# Patient Record
Sex: Male | Born: 1935 | Race: White | Hispanic: No | Marital: Married | Smoking: Former smoker
Health system: Southern US, Community
[De-identification: ages and names within clinical notes are randomized; demographics above are authoritative.]

## PROBLEM LIST (undated history)

## (undated) DIAGNOSIS — R7303 Prediabetes: Secondary | ICD-10-CM

## (undated) DIAGNOSIS — E039 Hypothyroidism, unspecified: Secondary | ICD-10-CM

## (undated) DIAGNOSIS — F32A Depression, unspecified: Secondary | ICD-10-CM

## (undated) DIAGNOSIS — K219 Gastro-esophageal reflux disease without esophagitis: Secondary | ICD-10-CM

## (undated) DIAGNOSIS — I10 Essential (primary) hypertension: Secondary | ICD-10-CM

## (undated) DIAGNOSIS — F419 Anxiety disorder, unspecified: Secondary | ICD-10-CM

## (undated) HISTORY — DX: Gastro-esophageal reflux disease without esophagitis: K21.9

## (undated) HISTORY — DX: Anxiety disorder, unspecified: F41.9

## (undated) HISTORY — PX: COLONOSCOPY: SHX174

---

## 2008-05-05 ENCOUNTER — Emergency Department (HOSPITAL_COMMUNITY): Admission: EM | Admit: 2008-05-05 | Discharge: 2008-05-05 | Payer: Self-pay | Admitting: Emergency Medicine

## 2008-11-10 ENCOUNTER — Encounter: Payer: Self-pay | Admitting: Gastroenterology

## 2008-11-23 ENCOUNTER — Encounter: Payer: Self-pay | Admitting: Gastroenterology

## 2008-11-23 ENCOUNTER — Ambulatory Visit: Payer: Self-pay | Admitting: Gastroenterology

## 2008-11-23 ENCOUNTER — Ambulatory Visit (HOSPITAL_COMMUNITY): Admission: RE | Admit: 2008-11-23 | Discharge: 2008-11-23 | Payer: Self-pay | Admitting: Gastroenterology

## 2008-11-25 ENCOUNTER — Encounter: Payer: Self-pay | Admitting: Gastroenterology

## 2010-12-13 NOTE — Op Note (Signed)
NAME:  Corey Frederick, Corey Frederick             ACCOUNT NO.:  [id number]   MEDICAL RECORD NO.:  [id number]          PATIENT TYPE:  AMB   LOCATION:  DAY                           FACILITY:  [facility]   PHYSICIAN:  Kassie Mends, M.D.      DATE OF BIRTH:  [birth year]   DATE OF PROCEDURE:  11/23/2008  DATE OF DISCHARGE:                               OPERATIVE REPORT   REFERRING PHYSICIAN:  Edward L. Corey Frederick, M.D.   PROCEDURE:  Colonoscopy with snare cautery polypectomy.   INDICATION FOR EXAM:  Mr. Corey Frederick is a 76 year old male who  presents for colon cancer screening.   FINDINGS:  1. A 6-mm sessile hepatic flexure polyp removed via snare cautery.  2. Frequent sigmoid colon diverticula.  3. Small internal hemorrhoids.  Otherwise no masses, inflammatory      changes, or AVM seen.   DIAGNOSES:  1. Diverticulosis.  2. Hemorrhoids.  3. Polyp.   RECOMMENDATIONS:  1. Screening colonoscopy in 10 years if he has a simple adenoma.  2. Will call Mr. Corey Frederick with the results of his biopsies.  3. No aspirin, NSAIDs, or anticoagulation for 7 days.   MEDICATIONS:  1. Demerol 100 mg IV.  2. Versed 4 mg IV.   DESCRIPTION OF PROCEDURE:  Physical exam was performed.  Informed  consent was obtained from the patient explaining benefits, risks and  alternatives to the procedure.  The patient was connected to monitor and  placed in left lateral position.  Continuous oxygen was provided by  nasal cannula.  IV medicine administered through an indwelling cannula.  After administration of sedation and rectal exam, the patient's rectum  was intubated and the scope was advanced under direct visualization to  the cecum.  The scope was removed slowly by carefully examining  the color, texture, anatomy, and integrity of the mucosa on the way out.  The patient was recovered in Endoscopy and discharged home in  satisfactory condition.   PATH:  Simple adenoma      Kassie Mends, M.D.  Electronically  Signed     SM/MEDQ  D:  11/23/2008  T:  11/24/2008  Job:  [id number]   cc:   Corey Frederick L. Corey Frederick, M.D.  Fax: [phone]

## 2011-08-03 ENCOUNTER — Encounter (INDEPENDENT_AMBULATORY_CARE_PROVIDER_SITE_OTHER): Payer: Self-pay | Admitting: *Deleted

## 2011-08-21 ENCOUNTER — Ambulatory Visit (INDEPENDENT_AMBULATORY_CARE_PROVIDER_SITE_OTHER): Payer: Self-pay | Admitting: Internal Medicine

## 2014-11-17 ENCOUNTER — Ambulatory Visit (HOSPITAL_COMMUNITY)
Admission: RE | Admit: 2014-11-17 | Discharge: 2014-11-17 | Disposition: A | Discharge status: Home / Self Care | Payer: Medicare Other | Source: Ambulatory Visit | Attending: Pulmonary Disease | Admitting: Pulmonary Disease

## 2014-11-17 ENCOUNTER — Other Ambulatory Visit (HOSPITAL_COMMUNITY): Payer: Self-pay | Admitting: Pulmonary Disease

## 2014-11-17 DIAGNOSIS — R2231 Localized swelling, mass and lump, right upper limb: Secondary | ICD-10-CM | POA: Diagnosis not present

## 2015-06-14 ENCOUNTER — Encounter (INDEPENDENT_AMBULATORY_CARE_PROVIDER_SITE_OTHER): Payer: Self-pay | Admitting: *Deleted

## 2015-06-14 ENCOUNTER — Encounter (INDEPENDENT_AMBULATORY_CARE_PROVIDER_SITE_OTHER): Payer: Self-pay | Admitting: Internal Medicine

## 2015-06-14 ENCOUNTER — Other Ambulatory Visit (INDEPENDENT_AMBULATORY_CARE_PROVIDER_SITE_OTHER): Payer: Self-pay | Admitting: Internal Medicine

## 2015-06-14 ENCOUNTER — Ambulatory Visit (INDEPENDENT_AMBULATORY_CARE_PROVIDER_SITE_OTHER): Payer: Medicare Other | Admitting: Internal Medicine

## 2015-06-14 VITALS — BP 108/78 | HR 66 | Temp 97.7°F | Resp 18 | Ht 70.0 in | Wt 158.5 lb

## 2015-06-14 DIAGNOSIS — N400 Enlarged prostate without lower urinary tract symptoms: Secondary | ICD-10-CM | POA: Insufficient documentation

## 2015-06-14 DIAGNOSIS — R1010 Upper abdominal pain, unspecified: Secondary | ICD-10-CM | POA: Diagnosis not present

## 2015-06-14 DIAGNOSIS — K219 Gastro-esophageal reflux disease without esophagitis: Secondary | ICD-10-CM | POA: Insufficient documentation

## 2015-06-14 DIAGNOSIS — R140 Abdominal distension (gaseous): Secondary | ICD-10-CM | POA: Diagnosis not present

## 2015-06-14 NOTE — Progress Notes (Signed)
Presenting complaint;  Bloating. Recent episode of epigastric pain with nausea and vomiting.  History of present illness:  Patient is 80 year old Caucasian male who is referred to courtesy of Dr. Juanetta Gosling for GI evaluation. He states she's had problems with bloating for the last few years. It generally is worse at night. He has smoked bloating now than he used to. On the evening of 05/22/2015 after eating at a [employer] he developed intractable bloating epigastric pain and nausea and self-induced vomiting in order to get relief. In addition to 3 episodes of self induce vomiting he had 3 spontaneous episodes. He noted scant amount of blood towards the end. He was evaluated in emergency room at no one tells in [facility]. He had extensive evaluation. Troponin level and EKG were normal. LFTs and serum lipase were normal. Patient also underwent abdominopelvic CT results of which are reviewed under lab data. He was given IV pantoprazole. He felt better and was discharged and advised to follow-up with PCP. He states he has no bloating when he wakes up in the morning. Bloating starts after lunch and gets worse after evening meal and most pronounced when he goes to bed. Bloating is never associated with nausea vomiting or pain. Only time he had pain across upper abdomen was 3 weeks ago when he got sick. He also complains of postprandial diarrhea. He denies melena or rectal bleeding. He gives history of heartburn. He states he was on pantoprazole possibly for 5 years. It was stopped earlier this year because of diarrhea. He was advised to go back on pantoprazole following his ER visit 3 weeks ago. He has no difficulty swallowing solids but at times he has difficulty with water. He does not feel that he swallows too much air. He tells me both his father and brother had surgery for gallbladder disease. He does not take OTC NSAIDs. He is not sure why he is taking ramipril. He states he's  never been diagnosed with hypertension.   Current Medications: Outpatient Encounter Prescriptions as of 06/14/2015  Medication Sig  . citalopram (CELEXA) 20 MG tablet Take 20 mg by mouth daily.  . finasteride (PROSCAR) 5 MG tablet Take 5 mg by mouth daily.  Marland Kitchen FLUZONE HIGH-DOSE 0.5 ML SUSY ADM 0.5ML IM UTD  . LORazepam (ATIVAN) 1 MG tablet Take 1 mg by mouth at bedtime.  . pantoprazole (PROTONIX) 40 MG tablet Take 40 mg by mouth daily.  . ramipril (ALTACE) 2.5 MG capsule Take 2.5 mg by mouth daily.  . tamsulosin (FLOMAX) 0.4 MG CAPS capsule Take 0.4 mg by mouth daily.  . Vitamin D, Cholecalciferol, 1000 UNITS CAPS Take 1,000 Units by mouth daily.   No facility-administered encounter medications on file as of 06/14/2015.   Past medical history: GERD. Colonoscopy in April 2010 with removal of small adenoma(Dr. Darrick Penna). Stress disorder. BPH. ? Mild hypertension(he states he has never been told he has hypertension but he is on low-dose ramipril).  Allergies: NKA  Family history: father died of coronary artery disease at age 46 . Mother lived to be 100. He has one brother age 31 with diabetes. Sister died of emphysema at age 51.   Social history: He is married but does not have any children. 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 He goes to fitness center 3 times a week. He has never smoked cigarettes and drinks alcohol occasionally.   Physical examination: :Blood pressure 108/78, pulse 66, temperature 97.7 F (36.5 C), temperature  source Oral, resp. rate 18, height 5\' 10"  (1.778 m), weight 158 lb 8 oz (71.895 kg). Patient is alert and in no acute distress. Conjunctiva is pink. Sclera is nonicteric Oropharyngeal mucosa is normal. No neck masses or thyromegaly noted. Cardiac exam with regular rhythm normal S1 and S2. No murmur or gallop noted. Lungs are clear to auscultation. Abdomen is symmetrical. Bowel sounds are normal. On palpation abdomen  is soft and nontender without organomegaly or masses.  Rectal examination deferred.  No LE edema or clubbing noted.  Labs/studies Results:  lab data from 05/23/2015  WBC 9.2, H&H 15.7 and 44.6. Platelet count 209K.  Serum sodium 139, potassium 3.6, chloride 92, CO2 26, BUN 20, creatinine 1.40 Glucose 249. Serum calcium 10.7. Bilirubin 1.10, AP 50, AST 32, ALT 31, total protein 7.4 and albumin 4.3.  Abdominopelvic CT with contrast  Moderate gastric distention, mild wall thickening to distal esophagus, small hypodense focus in liver and hypodense foci in both kidneys. These most likely represent cysts. small left renal stone, scattered colonic diverticula, mild prostate enlargement and granulomas spleen.    Assessment:  #1.Acute symptoms that patient suffered three weeks ago would appear to be secondary to gastroenteritis. #2. Chronic postprandial bloating. Need to rule out biliary tract disease, peptic ulcer disease or gastroparesis.   Plan:  Hemoccult 1. Upper abdominal ultrasound. Diagnostic esophagogastroduodenoscopy. Patient can try Phazyme or Gas-X after lunch and evening meal daily.

## 2015-06-14 NOTE — Patient Instructions (Signed)
Esophagogastroduodenoscopy to be scheduled. Physician will call with results of ultrasound when completed

## 2015-06-16 ENCOUNTER — Ambulatory Visit (HOSPITAL_COMMUNITY)
Admission: RE | Admit: 2015-06-16 | Discharge: 2015-06-16 | Disposition: A | Discharge status: Home / Self Care | Payer: Medicare Other | Source: Ambulatory Visit | Attending: Internal Medicine | Admitting: Internal Medicine

## 2015-06-16 DIAGNOSIS — R1010 Upper abdominal pain, unspecified: Secondary | ICD-10-CM

## 2015-06-16 DIAGNOSIS — R1013 Epigastric pain: Secondary | ICD-10-CM | POA: Diagnosis not present

## 2015-06-16 DIAGNOSIS — R932 Abnormal findings on diagnostic imaging of liver and biliary tract: Secondary | ICD-10-CM | POA: Diagnosis not present

## 2015-06-16 DIAGNOSIS — R140 Abdominal distension (gaseous): Secondary | ICD-10-CM | POA: Diagnosis not present

## 2015-06-17 ENCOUNTER — Encounter (INDEPENDENT_AMBULATORY_CARE_PROVIDER_SITE_OTHER): Payer: Self-pay | Admitting: *Deleted

## 2015-06-18 ENCOUNTER — Telehealth (INDEPENDENT_AMBULATORY_CARE_PROVIDER_SITE_OTHER): Payer: Self-pay | Admitting: *Deleted

## 2015-06-18 ENCOUNTER — Encounter (HOSPITAL_COMMUNITY)
Admission: RE | Disposition: A | Discharge status: Home Health | Payer: Self-pay | Source: Ambulatory Visit | Attending: Internal Medicine

## 2015-06-18 ENCOUNTER — Encounter (HOSPITAL_COMMUNITY): Payer: Self-pay | Admitting: *Deleted

## 2015-06-18 ENCOUNTER — Ambulatory Visit (HOSPITAL_COMMUNITY)
Admission: RE | Admit: 2015-06-18 | Discharge: 2015-06-18 | Disposition: A | Discharge status: Home Health | Payer: Medicare Other | Source: Ambulatory Visit | Attending: Internal Medicine | Admitting: Internal Medicine

## 2015-06-18 DIAGNOSIS — R197 Diarrhea, unspecified: Secondary | ICD-10-CM | POA: Insufficient documentation

## 2015-06-18 DIAGNOSIS — K228 Other specified diseases of esophagus: Secondary | ICD-10-CM | POA: Insufficient documentation

## 2015-06-18 DIAGNOSIS — K449 Diaphragmatic hernia without obstruction or gangrene: Secondary | ICD-10-CM | POA: Insufficient documentation

## 2015-06-18 DIAGNOSIS — R112 Nausea with vomiting, unspecified: Secondary | ICD-10-CM | POA: Insufficient documentation

## 2015-06-18 DIAGNOSIS — K219 Gastro-esophageal reflux disease without esophagitis: Secondary | ICD-10-CM

## 2015-06-18 DIAGNOSIS — F419 Anxiety disorder, unspecified: Secondary | ICD-10-CM | POA: Insufficient documentation

## 2015-06-18 DIAGNOSIS — R1013 Epigastric pain: Secondary | ICD-10-CM | POA: Diagnosis not present

## 2015-06-18 DIAGNOSIS — Z79899 Other long term (current) drug therapy: Secondary | ICD-10-CM | POA: Insufficient documentation

## 2015-06-18 DIAGNOSIS — Z87891 Personal history of nicotine dependence: Secondary | ICD-10-CM | POA: Diagnosis not present

## 2015-06-18 DIAGNOSIS — R140 Abdominal distension (gaseous): Secondary | ICD-10-CM | POA: Insufficient documentation

## 2015-06-18 HISTORY — PX: ESOPHAGOGASTRODUODENOSCOPY: SHX5428

## 2015-06-18 SURGERY — EGD (ESOPHAGOGASTRODUODENOSCOPY)
Anesthesia: Moderate Sedation

## 2015-06-18 MED ORDER — MIDAZOLAM HCL 5 MG/5ML IJ SOLN
INTRAMUSCULAR | Status: DC | PRN
  Administered 2015-06-18: 1 mg via INTRAVENOUS
  Administered 2015-06-18 (×2): 2 mg via INTRAVENOUS
  Administered 2015-06-18 (×2): 1 mg via INTRAVENOUS

## 2015-06-18 MED ORDER — SODIUM CHLORIDE 0.9 % IV SOLN
INTRAVENOUS | Status: DC
  Administered 2015-06-18: 1000 mL via INTRAVENOUS

## 2015-06-18 MED ORDER — BUTAMBEN-TETRACAINE-BENZOCAINE 2-2-14 % EX AERO
INHALATION_SPRAY | CUTANEOUS | Status: DC | PRN
  Administered 2015-06-18: 2 via TOPICAL

## 2015-06-18 MED ORDER — MEPERIDINE HCL 50 MG/ML IJ SOLN
INTRAMUSCULAR | Status: DC | PRN
  Administered 2015-06-18 (×2): 25 mg via INTRAVENOUS

## 2015-06-18 MED ORDER — MIDAZOLAM HCL 5 MG/5ML IJ SOLN
INTRAMUSCULAR | Status: AC
  Filled 2015-06-18: qty 10

## 2015-06-18 MED ORDER — ALIGN 4 MG PO CAPS: 1.0000 | ORAL_CAPSULE | Freq: Every day | ORAL | Status: DC

## 2015-06-18 MED ORDER — STERILE WATER FOR IRRIGATION IR SOLN
Status: DC | PRN
  Administered 2015-06-18: 2.5 mL

## 2015-06-18 MED ORDER — MEPERIDINE HCL 50 MG/ML IJ SOLN
INTRAMUSCULAR | Status: AC
  Filled 2015-06-18: qty 1

## 2015-06-18 NOTE — Op Note (Signed)
EGD PROCEDURE REPORT  PATIENT:  Corey Frederick  MR#:  [id number] Birthdate:  07/15/1935, 80 y.o., male Endoscopist:  Dr. Malissa HippoNajeeb U. Rehman, MD Referred By:  Dr. Fredirick MaudlinEdward L Hawkins, MD  Procedure Date: 06/18/2015  Procedure:   EGD  Indications: Patient is 80 year old Caucasian male who presents with the history of bloating which he has experienced over the last 3 years but has gotten worse particularly at night. He recently experienced epigastric pain with nausea and vomiting and was seen in emergency room. Ultrasound was negative for cholelithiasis.             Informed Consent:  The risks, benefits, alternatives & imponderables which include, but are not limited to, bleeding, infection, perforation, drug reaction and potential missed lesion have been reviewed.  The potential for biopsy, lesion removal, esophageal dilation, etc. have also been discussed.  Questions have been answered.  All parties agreeable.  Please see history & physical in medical record for more information.  Medications:  Demerol 50 mg IV Versed 7 mg IV Cetacaine spray topically for oropharyngeal anesthesia  Description of procedure:  The endoscope was introduced through the mouth and advanced to the second portion of the duodenum without difficulty or limitations. The mucosal surfaces were surveyed very carefully during advancement of the scope and upon withdrawal. Initially I was not able to advanced to every scope across the hypopharynx into esophagus. Esophagus was then intubated with ultra Slim scope and then with Q scope.  Findings:  Esophagus:  Somewhat dilated body of the esophagus but mucosa was normal. GEJ:  39 cm Hiatus:  42 cm Stomach:  Very large stomach. There was scant amount of food debris. Mucosa at gastric body and antrum was normal. Small semi-lunar-shaped pylorus. Angularis fundus and cardia were unremarkable. Scope could not be freely passed into second part of the duodenum which was felt to be because  of large stomach and not due to critical luminal compromise. Duodenum:  Limited examination of bulbar mucosa reveal no abnormality. Noncritical luminal narrowing. Second part of the duodenum was not seen.  Therapeutic/Diagnostic Maneuvers Performed:  None  Complications:  None  EBL: None  Impression: Mildly dilated body of the esophagus. Small sliding hiatal hernia. Markedly dilated stomach with deformed and narrowed pylorus. Limited examination of duodenal bulb and second part of the duodenum could not be examined.  Comment: I am concerned he may have GI pseudoobstruction.  Recommendations:  Standard instructions given. Upper GI series to be scheduled next week.   REHMAN,NAJEEB U  06/18/2015  4:59 PM  CC: Dr. Fredirick MaudlinHAWKINS,EDWARD L, MD & Dr. Bonnetta BarryNo ref. provider found

## 2015-06-18 NOTE — H&P (Signed)
Corey Frederick is an 80 y.o. male.   Chief Complaint: Patient is here for EGD. HPI: Patient is 80 year old Caucasian male who presents with postprandial bloating and intermittent diarrhea. He was recently evaluated in emergency room for epigastric pain and nausea and vomiting. He possibly had gastroenteritis. Ultrasound is negative for cholelithiasis. Stool was guaiac negative. He is feeling better since he has been on PPI. He is undergoing diagnostic EGD.  Past Medical History  Diagnosis Date  . GERD (gastroesophageal reflux disease)   . Anxiety     Past Surgical History  Procedure Laterality Date  . Colonoscopy      5 years ago - Dr. Darrick PennaFields    Family History  Problem Relation Age of Onset  . Anxiety disorder Mother   . Heart disease Father   . Emphysema Sister   . Diabetes Brother    Social History:  reports that he has quit smoking. He has never used smokeless tobacco. He reports that he drinks alcohol. His drug history is not on file.  Allergies: No Known Allergies  Medications Prior to Admission  Medication Sig Dispense Refill  . citalopram (CELEXA) 20 MG tablet Take 20 mg by mouth daily.    . finasteride (PROSCAR) 5 MG tablet Take 5 mg by mouth daily.    Marland Kitchen. LORazepam (ATIVAN) 1 MG tablet Take 1 mg by mouth at bedtime.    . pantoprazole (PROTONIX) 40 MG tablet Take 40 mg by mouth daily.    . ramipril (ALTACE) 2.5 MG capsule Take 2.5 mg by mouth daily.    . tamsulosin (FLOMAX) 0.4 MG CAPS capsule Take 0.4 mg by mouth daily.    . Vitamin D, Cholecalciferol, 1000 UNITS CAPS Take 1,000 Units by mouth daily.    Marland Kitchen. FLUZONE HIGH-DOSE 0.5 ML SUSY ADM 0.5ML IM UTD  0  . ibuprofen (ADVIL,MOTRIN) 200 MG tablet Take 200 mg by mouth every 6 (six) hours as needed for mild pain.    Marland Kitchen. ondansetron (ZOFRAN-ODT) 4 MG disintegrating tablet Take 1 tablet by mouth daily as needed for nausea or vomiting.   0    No results found for this or any previous visit (from the past 48 hour(s)). No  results found.  ROS  Blood pressure 132/87, pulse 66, temperature 98.1 F (36.7 C), temperature source Oral, resp. rate 19, height 5\' 10"  (1.778 m), weight 157 lb (71.215 kg), SpO2 100 %. Physical Exam  Constitutional: He appears well-developed and well-nourished.  HENT:  Mouth/Throat: Oropharynx is clear and moist.  Eyes: Conjunctivae are normal. No scleral icterus.  Neck: No thyromegaly present.  Cardiovascular: Normal rate, regular rhythm and normal heart sounds.   No murmur heard. Respiratory: Effort normal and breath sounds normal.  GI: Soft. He exhibits no distension and no mass. There is no tenderness.  Musculoskeletal: He exhibits no edema.  Lymphadenopathy:    He has no cervical adenopathy.  Neurological: He is alert.  Skin: Skin is warm and dry.     Assessment/Plan Bloating and GERD. History of epigastric pain. Diagnostic EGD.  REHMAN,NAJEEB U 06/18/2015, 4:13 PM

## 2015-06-18 NOTE — Telephone Encounter (Signed)
   Diagnosis:    Result(s)   Card 1 Negative:            Completed by: Larose Hiresammy Vira Chaplin , LPN   HEMOCCULT SENSA DEVELOPER: LOT#:  [id number] EXPIRATION DATE: 09-17   HEMOCCULT SENSA CARD:  LOT#:  [id number] EXPIRATION DATE: 07/18   CARD CONTROL RESULTS:  POSITIVE: Positive NEGATIVE: Negative    ADDITIONAL COMMENTS: Dr.Rehman was made aware of results. Patient is to have EGD later today and Dr.Rehman will go over results with him at that time.

## 2015-06-18 NOTE — Discharge Instructions (Signed)
Resume usual medications and diet. Driving for 24 hours. Upper GI series to be scheduled. Office will call.  Gastrointestinal Endoscopy, Care After Refer to this sheet in the next few weeks. These instructions provide you with information on caring for yourself after your procedure. Your caregiver may also give you more specific instructions. Your treatment has been planned according to current medical practices, but problems sometimes occur. Call your caregiver if you have any problems or questions after your procedure. HOME CARE INSTRUCTIONS  If you were given medicine to help you relax (sedative), do not drive, operate machinery, or sign important documents for 24 hours.  Avoid alcohol and hot or warm beverages for the first 24 hours after the procedure.  Only take over-the-counter or prescription medicines for pain, discomfort, or fever as directed by your caregiver. You may resume taking your normal medicines unless your caregiver tells you otherwise. Ask your caregiver when you may resume taking medicines that may cause bleeding, such as aspirin, clopidogrel, or warfarin.  You may return to your normal diet and activities on the day after your procedure, or as directed by your caregiver. Walking may help to reduce any bloated feeling in your abdomen.  Drink enough fluids to keep your urine clear or pale yellow.  You may gargle with salt water if you have a sore throat. SEEK IMMEDIATE MEDICAL CARE IF:  You have severe nausea or vomiting.  You have severe abdominal pain, abdominal cramps that last longer than 6 hours, or abdominal swelling (distention).  You have severe shoulder or back pain.  You have trouble swallowing.  You have shortness of breath, your breathing is shallow, or you are breathing faster than normal.  You have a fever or a rapid heartbeat.  You vomit blood or material that looks like coffee grounds.  You have bloody, black, or tarry stools. MAKE SURE  YOU:  Understand these instructions.  Will watch your condition.  Will get help right away if you are not doing well or get worse.   This information is not intended to replace advice given to you by your health care provider. Make sure you discuss any questions you have with your health care provider.

## 2015-06-20 NOTE — Telephone Encounter (Signed)
Stool guaiac negative results given to patient at the time of EGD.

## 2015-06-21 ENCOUNTER — Other Ambulatory Visit (INDEPENDENT_AMBULATORY_CARE_PROVIDER_SITE_OTHER): Payer: Self-pay | Admitting: Internal Medicine

## 2015-06-21 DIAGNOSIS — R112 Nausea with vomiting, unspecified: Secondary | ICD-10-CM

## 2015-06-21 DIAGNOSIS — G8929 Other chronic pain: Secondary | ICD-10-CM

## 2015-06-21 DIAGNOSIS — R1013 Epigastric pain: Principal | ICD-10-CM

## 2015-06-21 DIAGNOSIS — R140 Abdominal distension (gaseous): Secondary | ICD-10-CM

## 2015-06-22 ENCOUNTER — Ambulatory Visit (HOSPITAL_COMMUNITY)
Admission: RE | Admit: 2015-06-22 | Discharge: 2015-06-22 | Disposition: A | Discharge status: Home / Self Care | Payer: Medicare Other | Source: Ambulatory Visit | Attending: Internal Medicine | Admitting: Internal Medicine

## 2015-06-22 DIAGNOSIS — R112 Nausea with vomiting, unspecified: Secondary | ICD-10-CM | POA: Insufficient documentation

## 2015-06-22 DIAGNOSIS — R140 Abdominal distension (gaseous): Secondary | ICD-10-CM | POA: Diagnosis not present

## 2015-06-22 DIAGNOSIS — G8929 Other chronic pain: Secondary | ICD-10-CM | POA: Diagnosis not present

## 2015-06-22 DIAGNOSIS — R1013 Epigastric pain: Secondary | ICD-10-CM | POA: Insufficient documentation

## 2015-06-22 DIAGNOSIS — K219 Gastro-esophageal reflux disease without esophagitis: Secondary | ICD-10-CM | POA: Insufficient documentation

## 2015-06-22 DIAGNOSIS — K222 Esophageal obstruction: Secondary | ICD-10-CM | POA: Diagnosis not present

## 2015-06-23 ENCOUNTER — Encounter (HOSPITAL_COMMUNITY): Payer: Self-pay | Admitting: Internal Medicine

## 2015-09-15 ENCOUNTER — Ambulatory Visit (INDEPENDENT_AMBULATORY_CARE_PROVIDER_SITE_OTHER): Payer: Medicare Other | Admitting: Internal Medicine

## 2015-09-15 ENCOUNTER — Encounter (INDEPENDENT_AMBULATORY_CARE_PROVIDER_SITE_OTHER): Payer: Self-pay | Admitting: Internal Medicine

## 2015-09-15 VITALS — BP 100/70 | HR 64 | Temp 97.3°F | Ht 70.5 in | Wt 159.3 lb

## 2015-09-15 DIAGNOSIS — R140 Abdominal distension (gaseous): Secondary | ICD-10-CM

## 2015-09-15 NOTE — Patient Instructions (Addendum)
OV as needed per patient Samples of IBGARD given to patient x 6

## 2015-09-15 NOTE — Progress Notes (Signed)
   Subjective:    Patient ID: Corey Frederick, male    DOB: [birth year], 81 y.o.   MRN: [id number]  HPI Presents today for f/u. He says the bloating is better. He still has some flatus. He says he feels better after passing the gas. Appetite is good. No weight loss.  BMs x 1 a day and sometimes 2 a day. He is staying active. He works out about 3 times a week.  He denies any rectal bleeding.     06/18/2015: EGD  Indications: Patient is 81 year old Caucasian male who presents with the history of bloating which he has experienced over the last 3 years but has gotten worse particularly at night. He recently experienced epigastric pain with nausea and vomiting and was seen in emergency room. Ultrasound was negative for cholelithiasis.   Mildly dilated body of the esophagus. Small sliding hiatal hernia. Markedly dilated stomach with deformed and narrowed pylorus. Limited examination of duodenal bulb and second part of the duodenum could not be examined.  06/22/2015 UGI IMPRESSION: Mild persistent short segment smooth narrowing in the distal esophagus. Recommend clinical correlation for dysphagia. This could reflect a mild/early peptic stricture.  Otherwise unremarkable study.  Review of Systems Past Medical History  Diagnosis Date  . GERD (gastroesophageal reflux disease)   . Anxiety     Past Surgical History  Procedure Laterality Date  . Colonoscopy      5 years ago - Dr. Darrick Penna  . Esophagogastroduodenoscopy N/A 06/18/2015    Procedure: ESOPHAGOGASTRODUODENOSCOPY (EGD);  Surgeon: Malissa Hippo, MD;  Location: [facility];  Service: Endoscopy;  Laterality: N/A;  155 - moved to 2:40 - [name] notified pt    No Known Allergies  Current Outpatient Prescriptions on File Prior to Visit  Medication Sig Dispense Refill  . citalopram (CELEXA) 20 MG tablet Take 20 mg by mouth daily.    . finasteride (PROSCAR) 5 MG tablet Take 5 mg by mouth daily.    Marland Kitchen ibuprofen (ADVIL,MOTRIN) 200  MG tablet Take 200 mg by mouth every 6 (six) hours as needed for mild pain.    Marland Kitchen LORazepam (ATIVAN) 1 MG tablet Take 1 mg by mouth at bedtime.    . pantoprazole (PROTONIX) 40 MG tablet Take 40 mg by mouth daily.    . ramipril (ALTACE) 2.5 MG capsule Take 2.5 mg by mouth daily.    . tamsulosin (FLOMAX) 0.4 MG CAPS capsule Take 0.4 mg by mouth daily.    . Vitamin D, Cholecalciferol, 1000 UNITS CAPS Take 1,000 Units by mouth daily.    . Probiotic Product (ALIGN) 4 MG CAPS Take 1 capsule (4 mg total) by mouth daily. (Patient not taking: Reported on 09/15/2015)     No current facility-administered medications on file prior to visit.        Objective:   Physical ExamBlood pressure 100/70, pulse 64, temperature 97.3 F (36.3 C), height 5' 10.5" (1.791 m), weight 159 lb 4.8 oz (72.258 kg). Alert and oriented. Skin warm and dry. Oral mucosa is moist.   . Sclera anicteric, conjunctivae is pink. Thyroid not enlarged. No cervical lymphadenopathy. Lungs clear. Heart regular rate and rhythm.  Abdomen is soft. Bowel sounds are positive. No hepatomegaly. No abdominal masses felt. No tenderness.  No edema to lower extremities.         Assessment & Plan:  Bloating. Symptoms much better now. He has no GI complaints. Samples of IBGARD x 6 boxes given to patient. OV as needed per patient.

## 2015-11-24 ENCOUNTER — Telehealth (INDEPENDENT_AMBULATORY_CARE_PROVIDER_SITE_OTHER): Payer: Self-pay | Admitting: Internal Medicine

## 2015-11-24 NOTE — Telephone Encounter (Signed)
Patient stopped by the office requesting samples of Ibgard.  He wasn't able to stay and wait for me to check with you.  He asked that we call him when they are ready.  [phone]

## 2015-11-24 NOTE — Telephone Encounter (Signed)
Please let patient know we do not have any right now

## 2015-11-25 NOTE — Telephone Encounter (Signed)
Let him know he can buy OTC

## 2015-11-25 NOTE — Telephone Encounter (Signed)
I called the patient this morning and left him message stating that we do not have any, but to let us or his pharmacy know if he'd like some called in.  He stopped by the office shortly after I left the message looking for his samples.  I advised him we do not currently have any.  He would like this called to his pharmacy.

## 2015-11-29 NOTE — Telephone Encounter (Signed)
I called the patient and left him a message on his machine advising him that he can buy this over the counter.

## 2016-05-12 ENCOUNTER — Ambulatory Visit (INDEPENDENT_AMBULATORY_CARE_PROVIDER_SITE_OTHER): Payer: Medicare Other | Admitting: Urology

## 2016-05-12 DIAGNOSIS — N471 Phimosis: Secondary | ICD-10-CM | POA: Diagnosis not present

## 2016-05-12 DIAGNOSIS — N401 Enlarged prostate with lower urinary tract symptoms: Secondary | ICD-10-CM | POA: Diagnosis not present

## 2016-05-12 DIAGNOSIS — R39191 Need to immediately re-void: Secondary | ICD-10-CM

## 2016-08-04 ENCOUNTER — Encounter (INDEPENDENT_AMBULATORY_CARE_PROVIDER_SITE_OTHER): Payer: Self-pay

## 2016-08-04 ENCOUNTER — Encounter (INDEPENDENT_AMBULATORY_CARE_PROVIDER_SITE_OTHER): Payer: Self-pay | Admitting: Internal Medicine

## 2016-09-14 ENCOUNTER — Ambulatory Visit (INDEPENDENT_AMBULATORY_CARE_PROVIDER_SITE_OTHER): Payer: Medicare Other | Admitting: Internal Medicine

## 2019-10-08 DIAGNOSIS — M546 Pain in thoracic spine: Secondary | ICD-10-CM | POA: Diagnosis not present

## 2019-10-08 DIAGNOSIS — M9902 Segmental and somatic dysfunction of thoracic region: Secondary | ICD-10-CM | POA: Diagnosis not present

## 2019-10-08 DIAGNOSIS — M542 Cervicalgia: Secondary | ICD-10-CM | POA: Diagnosis not present

## 2019-10-08 DIAGNOSIS — M9901 Segmental and somatic dysfunction of cervical region: Secondary | ICD-10-CM | POA: Diagnosis not present

## 2019-10-15 DIAGNOSIS — M9902 Segmental and somatic dysfunction of thoracic region: Secondary | ICD-10-CM | POA: Diagnosis not present

## 2019-10-15 DIAGNOSIS — M9901 Segmental and somatic dysfunction of cervical region: Secondary | ICD-10-CM | POA: Diagnosis not present

## 2019-10-15 DIAGNOSIS — M542 Cervicalgia: Secondary | ICD-10-CM | POA: Diagnosis not present

## 2019-10-15 DIAGNOSIS — M546 Pain in thoracic spine: Secondary | ICD-10-CM | POA: Diagnosis not present

## 2019-10-22 DIAGNOSIS — M9901 Segmental and somatic dysfunction of cervical region: Secondary | ICD-10-CM | POA: Diagnosis not present

## 2019-10-22 DIAGNOSIS — M542 Cervicalgia: Secondary | ICD-10-CM | POA: Diagnosis not present

## 2019-10-22 DIAGNOSIS — M546 Pain in thoracic spine: Secondary | ICD-10-CM | POA: Diagnosis not present

## 2019-10-22 DIAGNOSIS — M9902 Segmental and somatic dysfunction of thoracic region: Secondary | ICD-10-CM | POA: Diagnosis not present

## 2019-10-29 DIAGNOSIS — M9902 Segmental and somatic dysfunction of thoracic region: Secondary | ICD-10-CM | POA: Diagnosis not present

## 2019-10-29 DIAGNOSIS — M542 Cervicalgia: Secondary | ICD-10-CM | POA: Diagnosis not present

## 2019-10-29 DIAGNOSIS — M9901 Segmental and somatic dysfunction of cervical region: Secondary | ICD-10-CM | POA: Diagnosis not present

## 2019-10-29 DIAGNOSIS — M546 Pain in thoracic spine: Secondary | ICD-10-CM | POA: Diagnosis not present

## 2019-11-05 DIAGNOSIS — M542 Cervicalgia: Secondary | ICD-10-CM | POA: Diagnosis not present

## 2019-11-05 DIAGNOSIS — M9902 Segmental and somatic dysfunction of thoracic region: Secondary | ICD-10-CM | POA: Diagnosis not present

## 2019-11-05 DIAGNOSIS — M546 Pain in thoracic spine: Secondary | ICD-10-CM | POA: Diagnosis not present

## 2019-11-05 DIAGNOSIS — M9901 Segmental and somatic dysfunction of cervical region: Secondary | ICD-10-CM | POA: Diagnosis not present

## 2019-11-11 DIAGNOSIS — I10 Essential (primary) hypertension: Secondary | ICD-10-CM | POA: Diagnosis not present

## 2019-11-11 DIAGNOSIS — I129 Hypertensive chronic kidney disease with stage 1 through stage 4 chronic kidney disease, or unspecified chronic kidney disease: Secondary | ICD-10-CM | POA: Diagnosis not present

## 2019-11-11 DIAGNOSIS — M546 Pain in thoracic spine: Secondary | ICD-10-CM | POA: Diagnosis not present

## 2019-11-11 DIAGNOSIS — E1169 Type 2 diabetes mellitus with other specified complication: Secondary | ICD-10-CM | POA: Diagnosis not present

## 2019-11-11 DIAGNOSIS — Z0000 Encounter for general adult medical examination without abnormal findings: Secondary | ICD-10-CM | POA: Diagnosis not present

## 2019-11-11 DIAGNOSIS — E782 Mixed hyperlipidemia: Secondary | ICD-10-CM | POA: Diagnosis not present

## 2019-11-11 DIAGNOSIS — K219 Gastro-esophageal reflux disease without esophagitis: Secondary | ICD-10-CM | POA: Diagnosis not present

## 2019-11-11 DIAGNOSIS — Z0189 Encounter for other specified special examinations: Secondary | ICD-10-CM | POA: Diagnosis not present

## 2019-11-11 DIAGNOSIS — F411 Generalized anxiety disorder: Secondary | ICD-10-CM | POA: Diagnosis not present

## 2019-11-13 DIAGNOSIS — M542 Cervicalgia: Secondary | ICD-10-CM | POA: Diagnosis not present

## 2019-11-13 DIAGNOSIS — M9902 Segmental and somatic dysfunction of thoracic region: Secondary | ICD-10-CM | POA: Diagnosis not present

## 2019-11-13 DIAGNOSIS — M9901 Segmental and somatic dysfunction of cervical region: Secondary | ICD-10-CM | POA: Diagnosis not present

## 2019-11-13 DIAGNOSIS — M546 Pain in thoracic spine: Secondary | ICD-10-CM | POA: Diagnosis not present

## 2019-11-17 DIAGNOSIS — I10 Essential (primary) hypertension: Secondary | ICD-10-CM | POA: Diagnosis not present

## 2019-11-17 DIAGNOSIS — E1169 Type 2 diabetes mellitus with other specified complication: Secondary | ICD-10-CM | POA: Diagnosis not present

## 2019-11-17 DIAGNOSIS — K219 Gastro-esophageal reflux disease without esophagitis: Secondary | ICD-10-CM | POA: Diagnosis not present

## 2019-11-17 DIAGNOSIS — F411 Generalized anxiety disorder: Secondary | ICD-10-CM | POA: Diagnosis not present

## 2019-11-17 DIAGNOSIS — E782 Mixed hyperlipidemia: Secondary | ICD-10-CM | POA: Diagnosis not present

## 2019-11-17 DIAGNOSIS — I129 Hypertensive chronic kidney disease with stage 1 through stage 4 chronic kidney disease, or unspecified chronic kidney disease: Secondary | ICD-10-CM | POA: Diagnosis not present

## 2019-11-17 DIAGNOSIS — N400 Enlarged prostate without lower urinary tract symptoms: Secondary | ICD-10-CM | POA: Diagnosis not present

## 2019-11-17 DIAGNOSIS — N529 Male erectile dysfunction, unspecified: Secondary | ICD-10-CM | POA: Diagnosis not present

## 2019-11-26 DIAGNOSIS — M546 Pain in thoracic spine: Secondary | ICD-10-CM | POA: Diagnosis not present

## 2019-11-26 DIAGNOSIS — M9902 Segmental and somatic dysfunction of thoracic region: Secondary | ICD-10-CM | POA: Diagnosis not present

## 2019-11-26 DIAGNOSIS — M542 Cervicalgia: Secondary | ICD-10-CM | POA: Diagnosis not present

## 2019-11-26 DIAGNOSIS — M9901 Segmental and somatic dysfunction of cervical region: Secondary | ICD-10-CM | POA: Diagnosis not present

## 2019-12-04 DIAGNOSIS — M9901 Segmental and somatic dysfunction of cervical region: Secondary | ICD-10-CM | POA: Diagnosis not present

## 2019-12-04 DIAGNOSIS — M546 Pain in thoracic spine: Secondary | ICD-10-CM | POA: Diagnosis not present

## 2019-12-04 DIAGNOSIS — M542 Cervicalgia: Secondary | ICD-10-CM | POA: Diagnosis not present

## 2019-12-04 DIAGNOSIS — M9902 Segmental and somatic dysfunction of thoracic region: Secondary | ICD-10-CM | POA: Diagnosis not present

## 2019-12-11 ENCOUNTER — Ambulatory Visit: Payer: Self-pay

## 2019-12-11 ENCOUNTER — Ambulatory Visit (INDEPENDENT_AMBULATORY_CARE_PROVIDER_SITE_OTHER): Payer: Medicare PPO | Admitting: Orthopaedic Surgery

## 2019-12-11 ENCOUNTER — Other Ambulatory Visit: Payer: Self-pay

## 2019-12-11 VITALS — BP 163/68 | HR 90 | Ht 70.0 in | Wt 154.0 lb

## 2019-12-11 DIAGNOSIS — M545 Low back pain: Secondary | ICD-10-CM

## 2019-12-11 DIAGNOSIS — M25512 Pain in left shoulder: Secondary | ICD-10-CM | POA: Diagnosis not present

## 2019-12-11 DIAGNOSIS — M79602 Pain in left arm: Secondary | ICD-10-CM

## 2019-12-11 NOTE — Progress Notes (Signed)
Office Visit Note   Patient: Corey Frederick           Date of Birth: 1935/07/28           MRN: [id number] Visit Date: 12/11/2019              Requested by: Kari Baars, MD No address on file PCP: Kari Baars, MD   Assessment & Plan: Visit Diagnoses:  1. Left shoulder pain, unspecified chronicity   2. Left arm pain     Plan: We will place him on a Medrol Dosepak 5 mg.  He will take it with food.  I will recheck him in 4 weeks if he is having persistent symptoms we will consider diagnostic MRI imaging.  Follow-Up Instructions: Return in about 4 weeks (around 01/08/2020).   Orders:  Orders Placed This Encounter  Procedures  . XR Shoulder Left  . XR Cervical Spine 2 or 3 views   No orders of the defined types were placed in this encounter.     Procedures: No procedures performed   Clinical Data: No additional findings.   Subjective: Chief Complaint  Patient presents with  . Left Shoulder - Pain    HPI 85 year old male with some stiffness in his neck discomfort turning his neck and pain along the medial border of the scapula which is been present for greater than a year he is right-hand dominant and has pain in his left shoulder does not seem to radiate down to his hand no gait disturbance.  He has been going to a chiropractor for treatment.  No past history of injury that he can recall he is used ibuprofen without really any relief.  Patient does have some anxiety and hypertension otherwise he has been active and healthy.  Review of Systems positive for GERD BPH hypertension and current neck pain otherwise negative is obtains HPI.   Objective: Vital Signs: BP (!) 163/68   Pulse 90   Ht 5\' 10"  (1.778 m)   Wt 154 lb (69.9 kg)   BMI 22.10 kg/m   Physical Exam Constitutional:      Appearance: He is well-developed.  HENT:     Head: Normocephalic and atraumatic.  Eyes:     Pupils: Pupils are equal, round, and reactive to light.  Neck:     Thyroid:  No thyromegaly.     Trachea: No tracheal deviation.  Cardiovascular:     Rate and Rhythm: Normal rate.  Pulmonary:     Effort: Pulmonary effort is normal.     Breath sounds: No wheezing.  Abdominal:     General: Bowel sounds are normal.     Palpations: Abdomen is soft.  Skin:    General: Skin is warm and dry.     Capillary Refill: Capillary refill takes less than 2 seconds.  Neurological:     Mental Status: He is alert and oriented to person, place, and time.  Psychiatric:        Behavior: Behavior normal.        Thought Content: Thought content normal.        Judgment: Judgment normal.     Ortho Exam patient has for percent cervical rotation.  Positive Spurling on the left negative on the right.  Upper extremity reflexes are intact normal heel toe gait.  Some increased pain with compression to get some relief of his neck pain with distraction.  Some tenderness along the medial border the scapula on the left no winging of the scapula  negative crossarm test negative drop arm test no subluxation long head of the biceps is normal.  Specialty Comments:  No specialty comments available.  Imaging: XR Cervical Spine 2 or 3 views  Result Date: 12/11/2019 [facility] lateral cervical spine x-rays demonstrate facet arthropathy particularly on the left at C4-5.  There is disc space narrowing and anterior posterior endplate spurring at V4-0. Impression: Mid cervical spondylosis and facet arthropathy as described above.  XR Shoulder Left  Result Date: 12/11/2019 Three-view x-rays left shoulder demonstrate some acromioclavicular spurring typical for his age no glenohumeral arthritis no soft tissue calcification. Impression: Normal left shoulder x-rays.    PMFS History: Patient Active Problem List   Diagnosis Date Noted  . Abdominal bloating 06/14/2015  . GERD (gastroesophageal reflux disease) 06/14/2015  . BPH (benign prostatic hypertrophy) 06/14/2015   Past Medical History:  Diagnosis Date    . Anxiety   . GERD (gastroesophageal reflux disease)     Family History  Problem Relation Age of Onset  . Anxiety disorder Mother   . Heart disease Father   . Emphysema Sister   . Diabetes Brother     Past Surgical History:  Procedure Laterality Date  . COLONOSCOPY     5 years ago - Dr. Oneida Alar  . ESOPHAGOGASTRODUODENOSCOPY N/A 06/18/2015   Procedure: ESOPHAGOGASTRODUODENOSCOPY (EGD);  Surgeon: Rogene Houston, MD;  Location: [facility];  Service: Endoscopy;  Laterality: N/A;  155 - moved to 2:40 - [name] notified pt   Social History   Occupational History  . Not on file  Tobacco Use  . Smoking status: Former Research scientist (life sciences)  . Smokeless tobacco: Never Used  Substance and Sexual Activity  . Alcohol use: Yes    Alcohol/week: 0.0 standard drinks    Comment: Social  . Drug use: Not on file  . Sexual activity: Not on file

## 2020-01-15 ENCOUNTER — Encounter: Payer: Self-pay | Admitting: Orthopaedic Surgery

## 2020-01-15 ENCOUNTER — Ambulatory Visit (INDEPENDENT_AMBULATORY_CARE_PROVIDER_SITE_OTHER): Payer: Medicare PPO | Admitting: Orthopaedic Surgery

## 2020-01-15 ENCOUNTER — Other Ambulatory Visit: Payer: Self-pay

## 2020-01-15 DIAGNOSIS — M4722 Other spondylosis with radiculopathy, cervical region: Secondary | ICD-10-CM | POA: Diagnosis not present

## 2020-01-15 NOTE — Progress Notes (Signed)
Office Visit Note   Patient: Corey Frederick           Date of Birth: 26-Oct-1934           MRN: [id number] Visit Date: 01/15/2020              Requested by: Kari Baars, MD No address on file PCP: Kari Baars, MD   Assessment & Plan: Visit Diagnoses:  1. Other spondylosis with radiculopathy, cervical region     Plan: Patient did not respond to prednisone Dosepak he tried to do some stretching exercises with his neck.  His symptoms are not severe enough to consider operative intervention at this point we will recheck him in 2 months.  If he develops a increased radicular symptoms he will call and let us know.  Pathophysiology discussed.  Follow-Up Instructions: Return in about 2 months (around 03/16/2020).   Orders:  No orders of the defined types were placed in this encounter.  No orders of the defined types were placed in this encounter.     Procedures: No procedures performed   Clinical Data: No additional findings.   Subjective: Chief Complaint  Patient presents with  . 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  Is worse in the morning states later in the day is actually better prednisone pack did not really help.  He did have spondylosis present on his x-rays particularly at C4-5 with facet arthropathy on the left.  He states later in the day does fairly well it does not keep him from sleeping.  He has a little bit of arthritis at the base of the thumb but does not have problems with numbness that radiates to his hand no gait disturbance.  Review of Systems all systems updated and are negative.  Of note is history of BPH and GERD.  He has been active and healthy.   Objective: Vital Signs: BP (!) 143/94   Pulse 70   Ht 5\' 10"  (1.778 m)   Wt 150 lb (68 kg)   BMI 21.52 kg/m   Physical Exam Constitutional:      Appearance: He is  well-developed.  HENT:     Head: Normocephalic and atraumatic.  Eyes:     Pupils: Pupils are equal, round, and reactive to light.  Neck:     Thyroid: No thyromegaly.     Trachea: No tracheal deviation.  Cardiovascular:     Rate and Rhythm: Normal rate.  Pulmonary:     Effort: Pulmonary effort is normal.     Breath sounds: No wheezing.  Abdominal:     General: Bowel sounds are normal.     Palpations: Abdomen is soft.  Skin:    General: Skin is warm and dry.     Capillary Refill: Capillary refill takes less than 2 seconds.  Neurological:     Mental Status: He is alert and oriented to person, place, and time.  Psychiatric:        Behavior: Behavior normal.        Thought Content: Thought content normal.        Judgment: Judgment normal.     Ortho Exam negative impingement left shoulder.  Significant brachial plexus tenderness on the left positive Spurling on the left pain radiates to the medial border the scapula.  Reflexes are symmetrical normal heel toe gait no clonus.  Specialty Comments:  No specialty comments available.  Imaging: No results found.   PMFS History: Patient Active Problem List   Diagnosis Date Noted  . Other spondylosis with radiculopathy, cervical region 01/15/2020  . Abdominal bloating 06/14/2015  . GERD (gastroesophageal reflux disease) 06/14/2015  . BPH (benign prostatic hypertrophy) 06/14/2015   Past Medical History:  Diagnosis Date  . Anxiety   . GERD (gastroesophageal reflux disease)     Family History  Problem Relation Age of Onset  . Anxiety disorder Mother   . Heart disease Father   . Emphysema Sister   . Diabetes Brother     Past Surgical History:  Procedure Laterality Date  . COLONOSCOPY     5 years ago - Dr. Oneida Alar  . ESOPHAGOGASTRODUODENOSCOPY N/A 06/18/2015   Procedure: ESOPHAGOGASTRODUODENOSCOPY (EGD);  Surgeon: Rogene Houston, MD;  Location: [facility];  Service: Endoscopy;  Laterality: N/A;  155 - moved to 2:40 - [name]  notified pt   Social History   Occupational History  . Not on file  Tobacco Use  . Smoking status: Former Research scientist (life sciences)  . Smokeless tobacco: Never Used  Substance and Sexual Activity  . Alcohol use: Yes    Alcohol/week: 0.0 standard drinks    Comment: Social  . Drug use: Not on file  . Sexual activity: Not on file

## 2020-01-19 ENCOUNTER — Telehealth: Payer: Self-pay | Admitting: Orthopaedic Surgery

## 2020-01-19 DIAGNOSIS — M4722 Other spondylosis with radiculopathy, cervical region: Secondary | ICD-10-CM

## 2020-01-19 NOTE — Telephone Encounter (Signed)
Patient called.   Was requesting a call back from Dr.Yates. Would not disclose why.   Call back: [phone]

## 2020-01-19 NOTE — Telephone Encounter (Signed)
Neck worse , he now is hurting all day, wants to proceed with MRI. Prednisone in may gave temp relief. ROV after MRI cervical spine. thanks

## 2020-01-19 NOTE — Telephone Encounter (Signed)
Please advise 

## 2020-01-19 NOTE — Telephone Encounter (Signed)
MRI order entered.

## 2020-02-03 ENCOUNTER — Ambulatory Visit
Admission: RE | Admit: 2020-02-03 | Discharge: 2020-02-03 | Disposition: A | Discharge status: Home / Self Care | Payer: Medicare PPO | Source: Ambulatory Visit | Attending: Orthopaedic Surgery | Admitting: Orthopaedic Surgery

## 2020-02-03 ENCOUNTER — Other Ambulatory Visit: Payer: Self-pay

## 2020-02-03 DIAGNOSIS — M4802 Spinal stenosis, cervical region: Secondary | ICD-10-CM | POA: Diagnosis not present

## 2020-02-03 DIAGNOSIS — M4722 Other spondylosis with radiculopathy, cervical region: Secondary | ICD-10-CM

## 2020-02-05 ENCOUNTER — Encounter: Payer: Self-pay | Admitting: Orthopaedic Surgery

## 2020-02-05 ENCOUNTER — Other Ambulatory Visit: Payer: Self-pay

## 2020-02-05 ENCOUNTER — Ambulatory Visit (INDEPENDENT_AMBULATORY_CARE_PROVIDER_SITE_OTHER): Payer: Medicare PPO | Admitting: Orthopaedic Surgery

## 2020-02-05 VITALS — BP 129/87 | HR 75 | Ht 70.0 in | Wt 149.0 lb

## 2020-02-05 DIAGNOSIS — M4802 Spinal stenosis, cervical region: Secondary | ICD-10-CM

## 2020-02-05 DIAGNOSIS — M4722 Other spondylosis with radiculopathy, cervical region: Secondary | ICD-10-CM | POA: Diagnosis not present

## 2020-02-05 NOTE — Progress Notes (Signed)
Office Visit Note   Patient: Corey Frederick           Date of Birth: 1935/03/06           MRN: [id number] Visit Date: 02/05/2020              Requested by: Kari Baars, MD No address on file PCP: Kari Baars, MD   Assessment & Plan: Visit Diagnoses:  1. Other spondylosis with radiculopathy, cervical region   2. Spinal stenosis of cervical region   3. Foraminal stenosis of cervical region     Plan: Patient's MRI scan shows left C5-6 paracentral disc protrusion which deflects the cord.  There is severe by foraminal stenosis worse on the left than right with moderate central spinal stenosis primarily from the disc osteophyte complex and left paracentral disc protrusion at C5-6.  We reviewed the MRI and discussed options.  Patient states this is been going on for many months and bothers him at night when he is trying to sleep during the day with activities and he wants to proceed with the surgical intervention.  We discussed single level anterior cervical fusion allograft and plate discussed risks of surgery including dysphonia, pseudoarthrosis reoperation.  Overnight stay in the hospital questions were elicited and answered he understands request to proceed.  He has failed prednisone Dosepak anti-inflammatories muscle relaxants, home exercise program with persistent symptoms times greater than 4 months.  Follow-Up Instructions: No follow-ups on file.   Orders:  No orders of the defined types were placed in this encounter.  No orders of the defined types were placed in this encounter.     Procedures: No procedures performed   Clinical Data: No additional findings.   Subjective: Chief Complaint  Patient presents with  . Neck - Pain, Follow-up    MRI Cervical Spine review    HPI 85 year old male returns with persistent problems with neck pain with left arm radicular pain.  He states he still has problems at night pain at the end of the day problems sleeping.  He did  not respond to prednisone Dosepak, anti-inflammatories.  He is taken Ativan at night.  Symptoms have persisted an MRI scan is available for review.  Patient is retired was a Psychologist, clinical and business.  No falling or gait disturbance.  No history of cancer no fever or chills.  Patient has increased arm symptoms if he is more active.  He states sometimes in the mid afternoon he has a little bit of improvement but then again problems at night trying to sleep bothers him all morning once he gets up.  He denies significant symptoms in his right upper extremity.  Review of Systems positive for BPH also GERD not currently active.  Positive for neck pain left arm pain.   Objective: Vital Signs: BP 129/87   Pulse 75   Ht 5\' 10"  (1.778 m)   Wt 149 lb (67.6 kg)   BMI 21.38 kg/m   Physical Exam Constitutional:      Appearance: He is well-developed.  HENT:     Head: Normocephalic and atraumatic.  Eyes:     Pupils: Pupils are equal, round, and reactive to light.  Neck:     Thyroid: No thyromegaly.     Trachea: No tracheal deviation.  Cardiovascular:     Rate and Rhythm: Normal rate.  Pulmonary:     Effort: Pulmonary effort is normal.     Breath sounds: No wheezing.  Abdominal:  General: Bowel sounds are normal.     Palpations: Abdomen is soft.  Skin:    General: Skin is warm and dry.     Capillary Refill: Capillary refill takes less than 2 seconds.  Neurological:     Mental Status: He is alert and oriented to person, place, and time.  Psychiatric:        Behavior: Behavior normal.        Thought Content: Thought content normal.        Judgment: Judgment normal.     Ortho Exam patient has full active passive range of motion of his shoulder.  Significant brachial plexus tenderness on the left.  Biceps triceps brachial radialis are symmetrical.  Positive Spurling on the left negative on the right negative Lhermitte.  Normal lower extremity reflexes no clonus normal heel toe  gait.  No shoulder subluxation no peripheral nerve tenderness over the ulnar nerve in the cubital tunnel.  Carpal tunnel exam is negative no thenar atrophy.  Specialty Comments:  No specialty comments available.  Imaging: CLINICAL DATA:  No known injury, numbness in the left arm  EXAM: MRI CERVICAL SPINE WITHOUT CONTRAST  TECHNIQUE: Multiplanar, multisequence MR imaging of the cervical spine was performed. No intravenous contrast was administered.  COMPARISON:  None.  FINDINGS: Alignment: Physiologic.  Vertebrae: No fracture, evidence of discitis, or bone lesion.  Cord: Normal signal and morphology.  Posterior Fossa, vertebral arteries, paraspinal tissues: Posterior fossa demonstrates no focal abnormality. Vertebral artery flow voids are maintained. Paraspinal soft tissues are unremarkable.  Disc levels:  Discs: Degenerative disease with disc height loss at C4-5 and C5-6.  C2-3: No significant disc bulge. Severe left facet arthropathy. Mild-moderate left foraminal stenosis. No spinal stenosis.  C3-4: Mild broad-based disc bulge. Bilateral uncovertebral degenerative changes. Moderate bilateral foraminal stenosis. No central canal stenosis.  C4-5: Broad-based disc osteophyte complex. Bilateral uncovertebral degenerative changes. Severe bilateral foraminal stenosis. No central canal stenosis.  C5-6: Broad-based disc osteophyte complex impressing on the ventral thecal sac with a left paracentral disc protrusion. Bilateral uncovertebral degenerative changes. Severe bilateral foraminal stenosis. Moderate spinal stenosis.  C6-7: No significant disc bulge. Moderate left facet arthropathy. Mild right facet arthropathy. No significant foraminal stenosis. No central canal stenosis.  C7-T1: No significant disc bulge. No neural foraminal stenosis. No central canal stenosis.  IMPRESSION: 1. Diffuse cervical spine spondylosis as described above. 2.  No acute  osseous injury of the cervical spine.   Electronically Signed   By: Elige Ko   On: 02/04/2020 13:05   PMFS History: Patient Active Problem List   Diagnosis Date Noted  . Spinal stenosis of cervical region 02/05/2020  . Foraminal stenosis of cervical region 02/05/2020  . Other spondylosis with radiculopathy, cervical region 01/15/2020  . Abdominal bloating 06/14/2015  . GERD (gastroesophageal reflux disease) 06/14/2015  . BPH (benign prostatic hypertrophy) 06/14/2015   Past Medical History:  Diagnosis Date  . Anxiety   . GERD (gastroesophageal reflux disease)     Family History  Problem Relation Age of Onset  . Anxiety disorder Mother   . Heart disease Father   . Emphysema Sister   . Diabetes Brother     Past Surgical History:  Procedure Laterality Date  . COLONOSCOPY     5 years ago - Dr. Darrick Penna  . ESOPHAGOGASTRODUODENOSCOPY N/A 06/18/2015   Procedure: ESOPHAGOGASTRODUODENOSCOPY (EGD);  Surgeon: Malissa Hippo, MD;  Location: [facility];  Service: Endoscopy;  Laterality: N/A;  155 - moved to 2:40 - [name] notified  pt   Social History   Occupational History  . Not on file  Tobacco Use  . Smoking status: Former Games developer  . Smokeless tobacco: Never Used  Substance and Sexual Activity  . Alcohol use: Yes    Alcohol/week: 0.0 standard drinks    Comment: Social  . Drug use: Not on file  . Sexual activity: Not on file

## 2020-02-10 ENCOUNTER — Telehealth: Payer: Self-pay | Admitting: Orthopaedic Surgery

## 2020-02-10 NOTE — Telephone Encounter (Signed)
I called discussed he can do surgery after trip to [place].

## 2020-02-10 NOTE — Telephone Encounter (Signed)
Surgery sheet received for c5-6 fusion.  Clearance request sent to patient's PCP  Dr. Dwana Melena.  Called patient to discuss clearances and surgery date. Patient would like to consider holding off the surgery until September since there is a reunion he would like to attend in August.  Also he would like to discuss delaying the surgery with Dr. Ophelia Charter before making plans.  He has questions relating to wearing the cervical collar for 6 weeks. He is not happy about the thought of sleeping in a recliner.    Patient's contact [phone].

## 2020-03-02 DIAGNOSIS — I10 Essential (primary) hypertension: Secondary | ICD-10-CM | POA: Diagnosis not present

## 2020-03-02 DIAGNOSIS — N401 Enlarged prostate with lower urinary tract symptoms: Secondary | ICD-10-CM | POA: Diagnosis not present

## 2020-03-11 ENCOUNTER — Ambulatory Visit: Payer: Medicare PPO | Admitting: Orthopaedic Surgery

## 2020-03-11 ENCOUNTER — Other Ambulatory Visit: Payer: Self-pay

## 2020-04-06 DIAGNOSIS — M546 Pain in thoracic spine: Secondary | ICD-10-CM | POA: Diagnosis not present

## 2020-04-06 DIAGNOSIS — N400 Enlarged prostate without lower urinary tract symptoms: Secondary | ICD-10-CM | POA: Diagnosis not present

## 2020-04-06 DIAGNOSIS — Z712 Person consulting for explanation of examination or test findings: Secondary | ICD-10-CM | POA: Diagnosis not present

## 2020-04-06 DIAGNOSIS — I10 Essential (primary) hypertension: Secondary | ICD-10-CM | POA: Diagnosis not present

## 2020-04-06 DIAGNOSIS — E782 Mixed hyperlipidemia: Secondary | ICD-10-CM | POA: Diagnosis not present

## 2020-04-06 DIAGNOSIS — Z0000 Encounter for general adult medical examination without abnormal findings: Secondary | ICD-10-CM | POA: Diagnosis not present

## 2020-04-06 DIAGNOSIS — N529 Male erectile dysfunction, unspecified: Secondary | ICD-10-CM | POA: Diagnosis not present

## 2020-04-06 DIAGNOSIS — Z0189 Encounter for other specified special examinations: Secondary | ICD-10-CM | POA: Diagnosis not present

## 2020-04-06 DIAGNOSIS — K219 Gastro-esophageal reflux disease without esophagitis: Secondary | ICD-10-CM | POA: Diagnosis not present

## 2020-04-08 DIAGNOSIS — Z0001 Encounter for general adult medical examination with abnormal findings: Secondary | ICD-10-CM | POA: Diagnosis not present

## 2020-04-08 DIAGNOSIS — I129 Hypertensive chronic kidney disease with stage 1 through stage 4 chronic kidney disease, or unspecified chronic kidney disease: Secondary | ICD-10-CM | POA: Diagnosis not present

## 2020-04-08 DIAGNOSIS — Z23 Encounter for immunization: Secondary | ICD-10-CM | POA: Diagnosis not present

## 2020-04-08 DIAGNOSIS — I10 Essential (primary) hypertension: Secondary | ICD-10-CM | POA: Diagnosis not present

## 2020-04-08 DIAGNOSIS — K219 Gastro-esophageal reflux disease without esophagitis: Secondary | ICD-10-CM | POA: Diagnosis not present

## 2020-04-08 DIAGNOSIS — N400 Enlarged prostate without lower urinary tract symptoms: Secondary | ICD-10-CM | POA: Diagnosis not present

## 2020-04-08 DIAGNOSIS — E782 Mixed hyperlipidemia: Secondary | ICD-10-CM | POA: Diagnosis not present

## 2020-04-08 DIAGNOSIS — F411 Generalized anxiety disorder: Secondary | ICD-10-CM | POA: Diagnosis not present

## 2020-04-08 DIAGNOSIS — N529 Male erectile dysfunction, unspecified: Secondary | ICD-10-CM | POA: Diagnosis not present

## 2020-04-08 DIAGNOSIS — E1169 Type 2 diabetes mellitus with other specified complication: Secondary | ICD-10-CM | POA: Diagnosis not present

## 2020-05-05 ENCOUNTER — Other Ambulatory Visit: Payer: Self-pay

## 2020-05-10 ENCOUNTER — Telehealth: Payer: Self-pay | Admitting: Orthopaedic Surgery

## 2020-05-10 NOTE — Telephone Encounter (Signed)
Please advise 

## 2020-05-10 NOTE — Telephone Encounter (Signed)
Patient called asked if Dr Ophelia Charter would call him concerning surgery. Patient want to know if he still need to get the procedure done? Patient said he just want to talk about the surgery possibly moving forward. Patient also want to know location that the surgery can be done. The number to contact patient is [phone]

## 2020-05-10 NOTE — Telephone Encounter (Signed)
I called,discussed,he is doing better and pain is minimal. He will call and make appt for me to recheck him again since he may not need surgery now.

## 2020-05-20 ENCOUNTER — Ambulatory Visit: Payer: Medicare PPO | Admitting: Surgery

## 2020-05-20 ENCOUNTER — Encounter: Payer: Self-pay | Admitting: Orthopaedic Surgery

## 2020-05-20 ENCOUNTER — Other Ambulatory Visit: Payer: Self-pay

## 2020-05-20 ENCOUNTER — Ambulatory Visit (INDEPENDENT_AMBULATORY_CARE_PROVIDER_SITE_OTHER): Payer: Medicare PPO | Admitting: Orthopaedic Surgery

## 2020-05-20 VITALS — BP 156/100 | HR 62 | Ht 70.0 in | Wt 144.5 lb

## 2020-05-20 DIAGNOSIS — M4722 Other spondylosis with radiculopathy, cervical region: Secondary | ICD-10-CM | POA: Diagnosis not present

## 2020-05-20 DIAGNOSIS — M4802 Spinal stenosis, cervical region: Secondary | ICD-10-CM | POA: Diagnosis not present

## 2020-05-20 NOTE — Progress Notes (Addendum)
Office Visit Note   Patient: Corey Frederick           Date of Birth: 10-27-1934           MRN: [id number] Visit Date: 05/20/2020              Requested by: Benita Stabile, MD [address] [place],  [place] [address] PCP: Benita Stabile, MD   Assessment & Plan: Visit Diagnoses:  1. Foraminal stenosis of cervical region   2. Other spondylosis with radiculopathy, cervical region     Plan: Patient does have symptoms progressively continues to improve no surgery is indicated.  He has increased symptoms he will call and return.  Again we reviewed MRI scan.  No myelopathic symptoms and his radicular symptoms have decreased on the left arm and hand. Follow-Up Instructions: Return if symptoms worsen or fail to improve.   Orders:  No orders of the defined types were placed in this encounter.  No orders of the defined types were placed in this encounter.     Procedures: No procedures performed   Clinical Data: No additional findings.   Subjective: Chief Complaint  Patient presents with  . Neck - Pain, Follow-up    HPI 85 year old male returns for follow-up of C5-6 large disc herniation which deflects the cord on the left side.  Originally scheduled for surgery delayed it for a planned reunion in [place] which ended up being canceled.  He states over the last month or so he is actually gotten somewhat better with less pain.  He is moving his neck better has a little bit less numbness and at this point is wanting to defer surgical intervention since he is improved.  Review of Systems 14 point system update unchanged from 02/05/2020.  BPH, GERD and cervical disc protrusion.   Objective: Vital Signs: BP (!) 156/100   Pulse 62   Ht 5\' 10"  (1.778 m)   Wt 144 lb 8 oz (65.5 kg)   BMI 20.73 kg/m   Physical Exam Constitutional:      Appearance: He is well-developed.  HENT:     Head: Normocephalic and atraumatic.  Eyes:     Pupils: Pupils are equal, round, and reactive to  light.  Neck:     Thyroid: No thyromegaly.     Trachea: No tracheal deviation.  Cardiovascular:     Rate and Rhythm: Normal rate.  Pulmonary:     Effort: Pulmonary effort is normal.     Breath sounds: No wheezing.  Abdominal:     General: Bowel sounds are normal.     Palpations: Abdomen is soft.  Skin:    General: Skin is warm and dry.     Capillary Refill: Capillary refill takes less than 2 seconds.  Neurological:     Mental Status: He is alert and oriented to person, place, and time.  Psychiatric:        Behavior: Behavior normal.        Thought Content: Thought content normal.        Judgment: Judgment normal.     Ortho Exam negative impingement right left shoulder.  Still has significant brachial plexus tenderness on the left.  Spurling moderately positive on the left negative Lhermitte.  Normal heel toe gait no clonus.  Specialty Comments:  No specialty comments available.  Imaging: No results found.   PMFS History: Patient Active Problem List   Diagnosis Date Noted  . Spinal stenosis of cervical region 02/05/2020  .  Foraminal stenosis of cervical region 02/05/2020  . Other spondylosis with radiculopathy, cervical region 01/15/2020  . Abdominal bloating 06/14/2015  . GERD (gastroesophageal reflux disease) 06/14/2015  . BPH (benign prostatic hypertrophy) 06/14/2015   Past Medical History:  Diagnosis Date  . Anxiety   . GERD (gastroesophageal reflux disease)     Family History  Problem Relation Age of Onset  . Anxiety disorder Mother   . Heart disease Father   . Emphysema Sister   . Diabetes Brother     Past Surgical History:  Procedure Laterality Date  . COLONOSCOPY     5 years ago - Dr. Darrick Penna  . ESOPHAGOGASTRODUODENOSCOPY N/A 06/18/2015   Procedure: ESOPHAGOGASTRODUODENOSCOPY (EGD);  Surgeon: Malissa Hippo, MD;  Location: [facility];  Service: Endoscopy;  Laterality: N/A;  155 - moved to 2:40 - [name] notified pt   Social History   Occupational  History  . Not on file  Tobacco Use  . Smoking status: Former Games developer  . Smokeless tobacco: Never Used  Substance and Sexual Activity  . Alcohol use: Yes    Alcohol/week: 0.0 standard drinks    Comment: Social  . Drug use: Not on file  . Sexual activity: Not on file

## 2020-05-25 ENCOUNTER — Other Ambulatory Visit (HOSPITAL_COMMUNITY): Payer: Medicare PPO

## 2020-05-28 ENCOUNTER — Ambulatory Visit: Admit: 2020-05-28 | Payer: Medicare PPO | Admitting: Orthopaedic Surgery

## 2020-05-28 SURGERY — ANTERIOR CERVICAL DECOMPRESSION/DISCECTOMY FUSION 1 LEVEL
Anesthesia: General

## 2020-06-16 ENCOUNTER — Other Ambulatory Visit: Payer: Self-pay

## 2020-06-22 ENCOUNTER — Ambulatory Visit: Payer: Medicare PPO | Admitting: Orthopaedic Surgery

## 2020-07-02 DIAGNOSIS — H2513 Age-related nuclear cataract, bilateral: Secondary | ICD-10-CM | POA: Diagnosis not present

## 2020-07-07 ENCOUNTER — Telehealth: Payer: Self-pay | Admitting: Radiology

## 2020-07-07 NOTE — Telephone Encounter (Signed)
Patient called with questions about surgery. He asked about the collar and I explained that he would wear it for 6 weeks. He asked about driving with it on and I told him that he cannot. He then questioned the cost that he would have to pay for the surgery. I explained that I knew nothing about costs and could see what I could find out.  He states that he knows about the cost from the hospital, however, he would like to know the cost for Dr. Ophelia Charter and if he will have costs from the pre admission visit as well.  Are you able to answer any of the cost questions or get him in touch with who can? Thanks.

## 2020-07-15 ENCOUNTER — Ambulatory Visit: Payer: Medicare PPO | Admitting: Surgery

## 2020-07-22 NOTE — Progress Notes (Signed)
[id number] - [place],  - [address] AT SEC OF [address] [place] [address] Phone: [phone] Fax: [phone]      Your procedure is scheduled on Wednesday December 29th.  Report to [facility] Main Entrance "A" at 10:30 A.M., and check in at the Admitting office.  Call this number if you have problems the morning of surgery:  [phone]  Call [phone] if you have any questions prior to your surgery date Monday-Friday 8am-4pm    Remember:  Do not eat after midnight the night before your surgery  You may drink clear liquids until 9:30am the morning of your surgery.   Clear liquids allowed are: Water, Non-Citrus Juices (without pulp), Carbonated Beverages, Clear Tea, Black Coffee Only, and Gatorade    Take these medicines the morning of surgery with A SIP OF WATER   FLUoxetine (PROZAC) 10 MG capsule  pantoprazole (PROTONIX) 40 MG tablet  rosuvastatin (CRESTOR) 10 MG tablet   As of today, STOP taking any Aspirin (unless otherwise instructed by your surgeon) Aleve, Naproxen, Ibuprofen, Motrin, Advil, Goody's, BC's, all herbal medications, fish oil, and all vitamins.                      Do not wear jewelry, make up, or nail polish            Do not wear lotions, powders, colognes, or deodorant.            Do not shave 48 hours prior to surgery.  Men may shave face and neck.            Do not bring valuables to the hospital.            [facility] is not responsible for any belongings or valuables.  Do NOT Smoke (Tobacco/Vaping) or drink Alcohol 24 hours prior to your procedure If you use a CPAP at night, you may bring all equipment for your overnight stay.   Contacts, glasses, dentures or bridgework may not be worn into surgery.      For patients admitted to the hospital, discharge time will be determined by your treatment team.   Patients discharged the day of surgery will not be allowed to drive  home, and someone needs to stay with them for 24 hours.    Special instructions:   [place]- Preparing For Surgery  Before surgery, you can play an important role. Because skin is not sterile, your skin needs to be as free of germs as possible. You can reduce the number of germs on your skin by washing with CHG (chlorahexidine gluconate) Soap before surgery.  CHG is an antiseptic cleaner which kills germs and bonds with the skin to continue killing germs even after washing.    Oral Hygiene is also important to reduce your risk of infection.  Remember - BRUSH YOUR TEETH THE MORNING OF SURGERY WITH YOUR REGULAR TOOTHPASTE  Please do not use if you have an allergy to CHG or antibacterial soaps. If your skin becomes reddened/irritated stop using the CHG.  Do not shave (including legs and underarms) for at least 48 hours prior to first CHG shower. It is OK to shave your face.  Please follow these instructions carefully.   1. Shower the NIGHT BEFORE SURGERY and the MORNING OF SURGERY with CHG Soap.   2. If you chose to wash your hair, wash your hair first as usual  with your normal shampoo.  3. After you shampoo, rinse your hair and body thoroughly to remove the shampoo.  4. Use CHG as you would any other liquid soap. You can apply CHG directly to the skin and wash gently with a scrungie or a clean washcloth.   5. Apply the CHG Soap to your body ONLY FROM THE NECK DOWN.  Do not use on open wounds or open sores. Avoid contact with your eyes, ears, mouth and genitals (private parts). Wash Face and genitals (private parts)  with your normal soap.   6. Wash thoroughly, paying special attention to the area where your surgery will be performed.  7. Thoroughly rinse your body with warm water from the neck down.  8. DO NOT shower/wash with your normal soap after using and rinsing off the CHG Soap.  9. Pat yourself dry with a CLEAN TOWEL.  10. Wear CLEAN PAJAMAS to bed the night before  surgery  11. Place CLEAN SHEETS on your bed the night of your first shower and DO NOT SLEEP WITH PETS.   Day of Surgery: Wear Clean/Comfortable clothing the morning of surgery Do not apply any deodorants/lotions.   Remember to brush your teeth WITH YOUR REGULAR TOOTHPASTE.   Please read over the following fact sheets that you were given.

## 2020-07-26 ENCOUNTER — Encounter (HOSPITAL_COMMUNITY): Payer: Self-pay

## 2020-07-26 ENCOUNTER — Other Ambulatory Visit (HOSPITAL_COMMUNITY)
Admission: RE | Admit: 2020-07-26 | Discharge: 2020-07-26 | Disposition: A | Discharge status: Home / Self Care | Payer: Medicare PPO | Source: Ambulatory Visit | Attending: Orthopaedic Surgery | Admitting: Orthopaedic Surgery

## 2020-07-26 ENCOUNTER — Other Ambulatory Visit (HOSPITAL_COMMUNITY): Payer: Medicare PPO

## 2020-07-26 ENCOUNTER — Other Ambulatory Visit: Payer: Self-pay

## 2020-07-26 ENCOUNTER — Encounter (HOSPITAL_COMMUNITY)
Admission: RE | Admit: 2020-07-26 | Discharge: 2020-07-26 | Disposition: A | Discharge status: Home / Self Care | Payer: Medicare PPO | Source: Ambulatory Visit | Attending: Orthopaedic Surgery | Admitting: Orthopaedic Surgery

## 2020-07-26 DIAGNOSIS — Z01812 Encounter for preprocedural laboratory examination: Secondary | ICD-10-CM | POA: Insufficient documentation

## 2020-07-26 DIAGNOSIS — Z01818 Encounter for other preprocedural examination: Secondary | ICD-10-CM | POA: Insufficient documentation

## 2020-07-26 DIAGNOSIS — Z20822 Contact with and (suspected) exposure to covid-19: Secondary | ICD-10-CM | POA: Insufficient documentation

## 2020-07-26 HISTORY — DX: Prediabetes: R73.03

## 2020-07-26 HISTORY — DX: Depression, unspecified: F32.A

## 2020-07-26 HISTORY — DX: Essential (primary) hypertension: I10

## 2020-07-26 LAB — CBC
HCT: 43.7 % (ref 39.0–52.0)
Hemoglobin: 15.3 g/dL (ref 13.0–17.0)
MCH: 31.5 pg (ref 26.0–34.0)
MCHC: 35 g/dL (ref 30.0–36.0)
MCV: 90.1 fL (ref 80.0–100.0)
Platelets: 221 10*3/uL (ref 150–400)
RBC: 4.85 MIL/uL (ref 4.22–5.81)
RDW: 13.2 % (ref 11.5–15.5)
WBC: 6.5 10*3/uL (ref 4.0–10.5)
nRBC: 0 % (ref 0.0–0.2)

## 2020-07-26 LAB — BASIC METABOLIC PANEL
Anion gap: 10 (ref 5–15)
BUN: 16 mg/dL (ref 8–23)
CO2: 26 mmol/L (ref 22–32)
Calcium: 10 mg/dL (ref 8.9–10.3)
Chloride: 103 mmol/L (ref 98–111)
Creatinine, Ser: 1.2 mg/dL (ref 0.61–1.24)
GFR, Estimated: >60 mL/min (ref >60)
Glucose, Bld: 161 mg/dL — ABNORMAL HIGH (ref 70–99)
Potassium: 3.5 mmol/L (ref 3.5–5.1)
Sodium: 139 mmol/L (ref 135–145)

## 2020-07-26 LAB — SURGICAL PCR SCREEN
MRSA, PCR: NEGATIVE
Staphylococcus aureus: NEGATIVE

## 2020-07-26 LAB — HEMOGLOBIN A1C
Hgb A1c MFr Bld: 6.4 % — ABNORMAL HIGH (ref 4.8–5.6)
Mean Plasma Glucose: 136.98 mg/dL

## 2020-07-26 LAB — SARS CORONAVIRUS 2 (TAT 6-24 HRS): SARS Coronavirus 2: NEGATIVE

## 2020-07-26 NOTE — Progress Notes (Signed)
PCP - Dr. Nita Sells Cardiologist - Denies  PPM/ICD - Denies  Chest x-ray - N/A EKG - 07/26/20 Stress Test - denies ECHO - Denies Cardiac Cath - Denies  Sleep Study - Denies  Patient states he has a hx of pre-diabetes.  Blood Thinner Instructions: N/A Aspirin Instructions: N/A  ERAS Protcol - Yes PRE-SURGERY Ensure or G2- No  COVID TEST- 07/26/20   Anesthesia review: No  Patient denies shortness of breath, fever, cough and chest pain at PAT appointment   All instructions explained to the patient, with a verbal understanding of the material. Patient agrees to go over the instructions while at home for a better understanding. Patient also instructed to self quarantine after being tested for COVID-19. The opportunity to ask questions was provided.

## 2020-07-28 ENCOUNTER — Observation Stay (HOSPITAL_COMMUNITY)
Admission: RE | Admit: 2020-07-28 | Discharge: 2020-07-29 | Disposition: A | Discharge status: Home / Self Care | Payer: Medicare PPO | Attending: Orthopaedic Surgery | Admitting: Orthopaedic Surgery

## 2020-07-28 ENCOUNTER — Encounter (HOSPITAL_COMMUNITY): Payer: Self-pay | Admitting: Orthopaedic Surgery

## 2020-07-28 ENCOUNTER — Ambulatory Visit (HOSPITAL_COMMUNITY): Payer: Medicare PPO

## 2020-07-28 ENCOUNTER — Ambulatory Visit (HOSPITAL_COMMUNITY): Payer: Medicare PPO | Admitting: Vascular Surgery

## 2020-07-28 ENCOUNTER — Encounter (HOSPITAL_COMMUNITY)
Admission: RE | Disposition: A | Discharge status: Home / Self Care | Payer: Self-pay | Source: Home / Self Care | Attending: Orthopaedic Surgery

## 2020-07-28 ENCOUNTER — Ambulatory Visit (HOSPITAL_COMMUNITY): Payer: Medicare PPO | Admitting: Anesthesiology

## 2020-07-28 ENCOUNTER — Other Ambulatory Visit: Payer: Self-pay

## 2020-07-28 DIAGNOSIS — M50122 Cervical disc disorder at C5-C6 level with radiculopathy: Principal | ICD-10-CM | POA: Insufficient documentation

## 2020-07-28 DIAGNOSIS — Z87891 Personal history of nicotine dependence: Secondary | ICD-10-CM | POA: Insufficient documentation

## 2020-07-28 DIAGNOSIS — M4322 Fusion of spine, cervical region: Secondary | ICD-10-CM | POA: Diagnosis not present

## 2020-07-28 DIAGNOSIS — Z79899 Other long term (current) drug therapy: Secondary | ICD-10-CM | POA: Diagnosis not present

## 2020-07-28 DIAGNOSIS — M4722 Other spondylosis with radiculopathy, cervical region: Secondary | ICD-10-CM | POA: Diagnosis not present

## 2020-07-28 DIAGNOSIS — I10 Essential (primary) hypertension: Secondary | ICD-10-CM | POA: Diagnosis not present

## 2020-07-28 DIAGNOSIS — Z419 Encounter for procedure for purposes other than remedying health state, unspecified: Secondary | ICD-10-CM

## 2020-07-28 DIAGNOSIS — Z9889 Other specified postprocedural states: Secondary | ICD-10-CM | POA: Diagnosis not present

## 2020-07-28 DIAGNOSIS — M4802 Spinal stenosis, cervical region: Secondary | ICD-10-CM | POA: Diagnosis not present

## 2020-07-28 DIAGNOSIS — N400 Enlarged prostate without lower urinary tract symptoms: Secondary | ICD-10-CM | POA: Diagnosis not present

## 2020-07-28 DIAGNOSIS — F418 Other specified anxiety disorders: Secondary | ICD-10-CM | POA: Diagnosis not present

## 2020-07-28 DIAGNOSIS — Z981 Arthrodesis status: Secondary | ICD-10-CM | POA: Diagnosis not present

## 2020-07-28 HISTORY — PX: ANTERIOR CERVICAL DECOMP/DISCECTOMY FUSION: SHX1161

## 2020-07-28 SURGERY — ANTERIOR CERVICAL DECOMPRESSION/DISCECTOMY FUSION 1 LEVEL
Anesthesia: General

## 2020-07-28 MED ORDER — PROPOFOL 10 MG/ML IV BOLUS
INTRAVENOUS | Status: DC | PRN
Start: 2020-07-28 — End: 2020-07-28
  Administered 2020-07-28: 110 mg via INTRAVENOUS

## 2020-07-28 MED ORDER — CEFAZOLIN SODIUM-DEXTROSE 2-4 GM/100ML-% IV SOLN
2.0000 g | INTRAVENOUS | Status: AC
  Administered 2020-07-28: 13:00:00 2 g via INTRAVENOUS
  Filled 2020-07-28: qty 100

## 2020-07-28 MED ORDER — ACETAMINOPHEN 650 MG RE SUPP: 650.0000 mg | RECTAL | Status: DC | PRN

## 2020-07-28 MED ORDER — BUPIVACAINE-EPINEPHRINE 0.5% -1:200000 IJ SOLN
INTRAMUSCULAR | Status: DC | PRN
  Administered 2020-07-28: 6 mL

## 2020-07-28 MED ORDER — ORAL CARE MOUTH RINSE: 15.0000 mL | Freq: Once | OROMUCOSAL | Status: AC

## 2020-07-28 MED ORDER — LORAZEPAM 0.5 MG PO TABS
1.0000 mg | ORAL_TABLET | Freq: Every day | ORAL | Status: DC
  Administered 2020-07-28: 21:00:00 1 mg via ORAL
  Filled 2020-07-28: qty 2

## 2020-07-28 MED ORDER — FLUOXETINE HCL 10 MG PO CAPS
10.0000 mg | ORAL_CAPSULE | Freq: Every day | ORAL | Status: DC
  Administered 2020-07-29: 09:00:00 10 mg via ORAL
  Filled 2020-07-28: qty 1

## 2020-07-28 MED ORDER — FENTANYL CITRATE (PF) 250 MCG/5ML IJ SOLN
INTRAMUSCULAR | Status: AC
  Filled 2020-07-28: qty 5

## 2020-07-28 MED ORDER — ROSUVASTATIN CALCIUM 5 MG PO TABS
10.0000 mg | ORAL_TABLET | Freq: Every day | ORAL | Status: DC
  Administered 2020-07-29: 09:00:00 10 mg via ORAL
  Filled 2020-07-28: qty 2

## 2020-07-28 MED ORDER — ONDANSETRON HCL 4 MG/2ML IJ SOLN
INTRAMUSCULAR | Status: AC
  Filled 2020-07-28: qty 2

## 2020-07-28 MED ORDER — DEXAMETHASONE SODIUM PHOSPHATE 10 MG/ML IJ SOLN
INTRAMUSCULAR | Status: DC | PRN
  Administered 2020-07-28: 4 mg via INTRAVENOUS

## 2020-07-28 MED ORDER — RAMIPRIL 5 MG PO CAPS
5.0000 mg | ORAL_CAPSULE | Freq: Every day | ORAL | Status: DC
  Administered 2020-07-28 – 2020-07-29 (×2): 5 mg via ORAL
  Filled 2020-07-28 (×2): qty 1

## 2020-07-28 MED ORDER — ONDANSETRON HCL 4 MG/2ML IJ SOLN: 4.0000 mg | Freq: Four times a day (QID) | INTRAMUSCULAR | Status: DC | PRN

## 2020-07-28 MED ORDER — EPHEDRINE 5 MG/ML INJ
INTRAVENOUS | Status: AC
  Filled 2020-07-28: qty 10

## 2020-07-28 MED ORDER — POLYETHYLENE GLYCOL 3350 17 G PO PACK
17.0000 g | PACK | Freq: Every day | ORAL | Status: DC
  Filled 2020-07-28: qty 1

## 2020-07-28 MED ORDER — PHENOL 1.4 % MT LIQD
1.0000 | OROMUCOSAL | Status: DC | PRN
  Administered 2020-07-28: 17:00:00 1 via OROMUCOSAL
  Filled 2020-07-28: qty 177

## 2020-07-28 MED ORDER — HYDROCODONE-ACETAMINOPHEN 10-325 MG PO TABS: 1.0000 | ORAL_TABLET | Freq: Four times a day (QID) | ORAL | 0 refills | Status: DC | PRN

## 2020-07-28 MED ORDER — PANTOPRAZOLE SODIUM 40 MG PO TBEC
40.0000 mg | DELAYED_RELEASE_TABLET | Freq: Every day | ORAL | Status: DC
  Administered 2020-07-29: 09:00:00 40 mg via ORAL
  Filled 2020-07-28: qty 1

## 2020-07-28 MED ORDER — PROPOFOL 10 MG/ML IV BOLUS
INTRAVENOUS | Status: AC
  Filled 2020-07-28: qty 40

## 2020-07-28 MED ORDER — CHLORHEXIDINE GLUCONATE 0.12 % MT SOLN
15.0000 mL | Freq: Once | OROMUCOSAL | Status: AC
  Administered 2020-07-28: 10:00:00 15 mL via OROMUCOSAL
  Filled 2020-07-28: qty 15

## 2020-07-28 MED ORDER — ONDANSETRON HCL 4 MG/2ML IJ SOLN
INTRAMUSCULAR | Status: DC | PRN
  Administered 2020-07-28: 4 mg via INTRAVENOUS

## 2020-07-28 MED ORDER — ROCURONIUM BROMIDE 10 MG/ML (PF) SYRINGE
PREFILLED_SYRINGE | INTRAVENOUS | Status: DC | PRN
  Administered 2020-07-28: 60 mg via INTRAVENOUS

## 2020-07-28 MED ORDER — 0.9 % SODIUM CHLORIDE (POUR BTL) OPTIME
TOPICAL | Status: DC | PRN
  Administered 2020-07-28: 12:00:00 1000 mL

## 2020-07-28 MED ORDER — BUPIVACAINE-EPINEPHRINE 0.5% -1:200000 IJ SOLN
INTRAMUSCULAR | Status: AC
  Filled 2020-07-28: qty 1

## 2020-07-28 MED ORDER — ACETAMINOPHEN 325 MG PO TABS
650.0000 mg | ORAL_TABLET | ORAL | Status: DC | PRN
  Administered 2020-07-29: 650 mg via ORAL
  Filled 2020-07-28: qty 2

## 2020-07-28 MED ORDER — EPHEDRINE SULFATE-NACL 50-0.9 MG/10ML-% IV SOSY
PREFILLED_SYRINGE | INTRAVENOUS | Status: DC | PRN
  Administered 2020-07-28 (×2): 5 mg via INTRAVENOUS

## 2020-07-28 MED ORDER — METHOCARBAMOL 500 MG PO TABS
500.0000 mg | ORAL_TABLET | Freq: Four times a day (QID) | ORAL | Status: DC | PRN
  Administered 2020-07-28 – 2020-07-29 (×3): 500 mg via ORAL
  Filled 2020-07-28 (×3): qty 1

## 2020-07-28 MED ORDER — DOCUSATE SODIUM 100 MG PO CAPS
100.0000 mg | ORAL_CAPSULE | Freq: Two times a day (BID) | ORAL | Status: DC
  Administered 2020-07-28 – 2020-07-29 (×2): 100 mg via ORAL
  Filled 2020-07-28 (×2): qty 1

## 2020-07-28 MED ORDER — MENTHOL 3 MG MT LOZG: 1.0000 | LOZENGE | OROMUCOSAL | Status: DC | PRN

## 2020-07-28 MED ORDER — SUGAMMADEX SODIUM 200 MG/2ML IV SOLN
INTRAVENOUS | Status: DC | PRN
  Administered 2020-07-28: 150 mg via INTRAVENOUS
  Administered 2020-07-28: 50 mg via INTRAVENOUS

## 2020-07-28 MED ORDER — HYDROCODONE-ACETAMINOPHEN 7.5-325 MG PO TABS
1.0000 | ORAL_TABLET | Freq: Four times a day (QID) | ORAL | Status: DC | PRN
  Administered 2020-07-28 – 2020-07-29 (×4): 1 via ORAL
  Filled 2020-07-28 (×4): qty 1

## 2020-07-28 MED ORDER — FENTANYL CITRATE (PF) 250 MCG/5ML IJ SOLN
INTRAMUSCULAR | Status: DC | PRN
  Administered 2020-07-28 (×3): 50 ug via INTRAVENOUS

## 2020-07-28 MED ORDER — LIDOCAINE 2% (20 MG/ML) 5 ML SYRINGE
INTRAMUSCULAR | Status: DC | PRN
  Administered 2020-07-28: 100 mg via INTRAVENOUS

## 2020-07-28 MED ORDER — SODIUM CHLORIDE 0.9 % IV SOLN: INTRAVENOUS | Status: DC

## 2020-07-28 MED ORDER — SODIUM CHLORIDE 0.9% FLUSH: 3.0000 mL | INTRAVENOUS | Status: DC | PRN

## 2020-07-28 MED ORDER — SODIUM CHLORIDE 0.9% FLUSH
3.0000 mL | Freq: Two times a day (BID) | INTRAVENOUS | Status: DC
  Administered 2020-07-28: 21:00:00 3 mL via INTRAVENOUS

## 2020-07-28 MED ORDER — FENTANYL CITRATE (PF) 100 MCG/2ML IJ SOLN: 25.0000 ug | INTRAMUSCULAR | Status: DC | PRN

## 2020-07-28 MED ORDER — ACETAMINOPHEN 500 MG PO TABS
1000.0000 mg | ORAL_TABLET | Freq: Once | ORAL | Status: AC
  Administered 2020-07-28: 1000 mg via ORAL
  Filled 2020-07-28: qty 2

## 2020-07-28 MED ORDER — LACTATED RINGERS IV SOLN: INTRAVENOUS | Status: DC

## 2020-07-28 MED ORDER — SODIUM CHLORIDE 0.9 % IV SOLN: 250.0000 mL | INTRAVENOUS | Status: DC

## 2020-07-28 MED ORDER — METHOCARBAMOL 1000 MG/10ML IJ SOLN
500.0000 mg | Freq: Four times a day (QID) | INTRAVENOUS | Status: DC | PRN
  Filled 2020-07-28: qty 5

## 2020-07-28 MED ORDER — DEXAMETHASONE SODIUM PHOSPHATE 10 MG/ML IJ SOLN
INTRAMUSCULAR | Status: AC
  Filled 2020-07-28: qty 1

## 2020-07-28 MED ORDER — THROMBIN 5000 UNITS EX SOLR
CUTANEOUS | Status: AC
  Filled 2020-07-28: qty 10000

## 2020-07-28 MED ORDER — ONDANSETRON HCL 4 MG PO TABS: 4.0000 mg | ORAL_TABLET | Freq: Four times a day (QID) | ORAL | Status: DC | PRN

## 2020-07-28 MED ORDER — HYDROMORPHONE HCL 1 MG/ML IJ SOLN: 0.5000 mg | INTRAMUSCULAR | Status: DC | PRN

## 2020-07-28 MED ORDER — ONDANSETRON HCL 4 MG/2ML IJ SOLN: 4.0000 mg | Freq: Once | INTRAMUSCULAR | Status: DC | PRN

## 2020-07-28 MED ORDER — PHENYLEPHRINE HCL-NACL 10-0.9 MG/250ML-% IV SOLN
INTRAVENOUS | Status: DC | PRN
  Administered 2020-07-28: 25 ug/min via INTRAVENOUS

## 2020-07-28 SURGICAL SUPPLY — 54 items
BAND RUBBER #18 3X1/16 STRL (MISCELLANEOUS) ×4 IMPLANT
BENZOIN TINCTURE PRP APPL 2/3 (GAUZE/BANDAGES/DRESSINGS) ×2 IMPLANT
BIT DRILL SM SPINE QC 14 (BIT) ×2 IMPLANT
BLADE CLIPPER SURG (BLADE) IMPLANT
BONE CC-ACS 11X14X7 6D (Bone Implant) ×2 IMPLANT
BUR ROUND FLUTED 4 SOFT TCH (BURR) ×2 IMPLANT
CHIPS BONE CANC-ACS11X14X7 6D (Bone Implant) ×1 IMPLANT
COLLAR CERV LO CONTOUR FIRM DE (SOFTGOODS) ×2 IMPLANT
COVER MAYO STAND STRL (DRAPES) ×2 IMPLANT
COVER SURGICAL LIGHT HANDLE (MISCELLANEOUS) IMPLANT
COVER WAND RF STERILE (DRAPES) ×2 IMPLANT
DRAPE C-ARM 42X72 X-RAY (DRAPES) ×2 IMPLANT
DRAPE HALF SHEET 40X57 (DRAPES) ×4 IMPLANT
DRAPE MICROSCOPE LEICA (MISCELLANEOUS) ×2 IMPLANT
DURAPREP 6ML APPLICATOR 50/CS (WOUND CARE) ×2 IMPLANT
ELECT COATED BLADE 2.86 ST (ELECTRODE) ×2 IMPLANT
ELECT REM PT RETURN 9FT ADLT (ELECTROSURGICAL) ×2
ELECTRODE REM PT RTRN 9FT ADLT (ELECTROSURGICAL) ×1 IMPLANT
EVACUATOR 1/8 PVC DRAIN (DRAIN) ×2 IMPLANT
GAUZE SPONGE 4X4 12PLY STRL (GAUZE/BANDAGES/DRESSINGS) ×2 IMPLANT
GLOVE BIO SURGEON STRL SZ 6.5 (GLOVE) ×6 IMPLANT
GLOVE BIOGEL PI IND STRL 8 (GLOVE) ×2 IMPLANT
GLOVE BIOGEL PI INDICATOR 8 (GLOVE) ×2
GLOVE ORTHO TXT STRL SZ7.5 (GLOVE) ×4 IMPLANT
GOWN STRL REUS W/ TWL LRG LVL3 (GOWN DISPOSABLE) ×2 IMPLANT
GOWN STRL REUS W/ TWL XL LVL3 (GOWN DISPOSABLE) ×1 IMPLANT
GOWN STRL REUS W/TWL 2XL LVL3 (GOWN DISPOSABLE) ×2 IMPLANT
GOWN STRL REUS W/TWL LRG LVL3 (GOWN DISPOSABLE) ×2
GOWN STRL REUS W/TWL XL LVL3 (GOWN DISPOSABLE) ×1
HALTER HD/CHIN CERV TRACTION D (MISCELLANEOUS) ×2 IMPLANT
KIT BASIN OR (CUSTOM PROCEDURE TRAY) ×2 IMPLANT
KIT TURNOVER KIT B (KITS) ×2 IMPLANT
MANIFOLD NEPTUNE II (INSTRUMENTS) IMPLANT
NEEDLE 25GX 5/8IN NON SAFETY (NEEDLE) ×2 IMPLANT
NS IRRIG 1000ML POUR BTL (IV SOLUTION) ×2 IMPLANT
PACK ORTHO CERVICAL (CUSTOM PROCEDURE TRAY) ×2 IMPLANT
PAD ARMBOARD 7.5X6 YLW CONV (MISCELLANEOUS) IMPLANT
PATTIES SURGICAL .5 X.5 (GAUZE/BANDAGES/DRESSINGS) IMPLANT
PLATE ANT CERV XTEND 1 LV 14 (Plate) ×2 IMPLANT
POSITIONER HEAD DONUT 9IN (MISCELLANEOUS) ×2 IMPLANT
RESTRAINT LIMB HOLDER UNIV (RESTRAINTS) ×2 IMPLANT
SCREW XTD VAR 4.2 SELF TAP (Screw) ×8 IMPLANT
SPONGE INTESTINAL PEANUT (DISPOSABLE) ×4 IMPLANT
STRIP CLOSURE SKIN 1/2X4 (GAUZE/BANDAGES/DRESSINGS) ×2 IMPLANT
SURGIFLO W/THROMBIN 8M KIT (HEMOSTASIS) IMPLANT
SUT BONE WAX W31G (SUTURE) ×2 IMPLANT
SUT VIC AB 3-0 PS2 18 (SUTURE) ×2
SUT VIC AB 3-0 PS2 18XBRD (SUTURE) ×2 IMPLANT
SUT VIC AB 4-0 PS2 27 (SUTURE) ×2 IMPLANT
SYR BULB EAR ULCER 3OZ GRN STR (SYRINGE) ×2 IMPLANT
TAPE CLOTH SURG 6X10 WHT LF (GAUZE/BANDAGES/DRESSINGS) ×2 IMPLANT
TOWEL GREEN STERILE (TOWEL DISPOSABLE) ×2 IMPLANT
TOWEL GREEN STERILE FF (TOWEL DISPOSABLE) ×2 IMPLANT
WATER STERILE IRR 1000ML POUR (IV SOLUTION) ×2 IMPLANT

## 2020-07-28 NOTE — Op Note (Signed)
Preop diagnosis: Cervical spondylosis with large left paracentral disc protrusion and radiculopathy.  Postop diagnosis: Same  Procedure: C5-6 anterior cervical discectomy and fusion, allograft and plate.  Surgeon: Annell Greening, MD  Assistant: Zonia Kief, PA-C medically necessary and present with entire procedure  EBL less than 100 cc.  Drains 1 Hemovac.  Anesthesia General orotracheal +6 cc Marcaine skin local at end of case.  Implants: Globus XTEND 14 mm plate 14 mm screws x4.  7 mm cortical cancellous allograft.  Procedure: After standard prepping draping wrist restraints used for pulldown if needed at all traction application without weight neck was prepped with DuraPrep preoperative Ancef prophylaxis while the DuraPrep was dry timeout procedure was completed area squared with towels Betadine Steri-Drape one half of a Betadine Steri-Drape application sterile metal stent the head thyroid sheets and drapes were applied.  Incision was made starting the midline in the left.  Platysma was divided in line with the fibers blunt dissection down to prominent spur initial crosstable C arm showed short 25 needle with straight clamp was placed at the C6-7 level we moved up 1 level where there is also large prominent spur at C5-6 second picture taken confirming appropriate level it was marked by removing the spur and taking some chunks of the disc out with a 15 scalpel blade and regular pituitary.  Self-retaining retractor teeth blades Cloward right and left was made smooth blade cephalad caudad.  Upper microscope was draped brought in we progressed back to posterior ligament taking large chunks of disc off of the cord there was a kissing osteophytes posteriorly which were removed and cord is decompressed was bulging in between the endplates.  Trial sizers 5 6 and 7 showed 7 gave a good fit a was too tight would not completely fit into the space.  Epidural space was dry.  All chunks of ligament had been  removed using the operative microscope and 1 and 2 mm Kerrisons.  Similar graft was inserted with CRNA pulling on the rope countersunk 1 mm.  14 mm plate selected held single screw checked under fluoroscopic images it was midline on AP good position lateral in all 4 screws were placed final spot pictures were taken.  Good position alignment.  Tiny screwdriver was used to lock all 4 screws.  Hemovac placed in line with the neck incision on the left and out technique 3-0 Vicryl in the platysma 4-0 Vicryl subcuticular closure tincture benzoin Steri-Strips 4 x 4 tape and soft collar was applied patient tolerated procedure well transfer the care room in stable condition.

## 2020-07-28 NOTE — H&P (Signed)
Subjective:    Chief Complaint  Patient presents with  . Neck - Pain, Follow-up    HPI 85 year old male returns for follow-up of C5-6 large disc herniation which deflects the cord on the left side.  Originally scheduled for surgery delayed it for a planned reunion in [place] which ended up being canceled.  He states over the last month or so he is actually gotten somewhat better with less pain.  He is moving his neck better has a little bit less numbness and at that  point was wanting to defer surgical intervention since he is improved.Symptoms have gotten worse and he now wants to proceed with surgery.   Review of Systems 14 point system update unchanged from 02/05/2020.  BPH, GERD and cervical disc protrusion.   Objective: Vital Signs: BP (!) 156/100   Pulse 62   Ht 5\' 10"  (1.778 m)   Wt 144 lb 8 oz (65.5 kg)   BMI 20.73 kg/m   Physical Exam Constitutional:      Appearance: He is well-developed.  HENT:     Head: Normocephalic and atraumatic.  Eyes:     Pupils: Pupils are equal, round, and reactive to light.  Neck:     Thyroid: No thyromegaly.     Trachea: No tracheal deviation.  Cardiovascular:     Rate and Rhythm: Normal rate.  Pulmonary:     Effort: Pulmonary effort is normal.     Breath sounds: No wheezing.  Abdominal:     General: Bowel sounds are normal.     Palpations: Abdomen is soft.  Skin:    General: Skin is warm and dry.     Capillary Refill: Capillary refill takes less than 2 seconds.  Neurological:     Mental Status: He is alert and oriented to person, place, and time.  Psychiatric:        Behavior: Behavior normal.        Thought Content: Thought content normal.        Judgment: Judgment normal.     Ortho Exam negative impingement right left shoulder.  Still has significant brachial plexus tenderness on the left.  Spurling moderately positive on the left negative Lhermitte.  Normal heel toe gait no clonus.  Specialty Comments:  No  specialty comments available.  Imaging: No results found.   PMFS History:     Patient Active Problem List   Diagnosis Date Noted  . Spinal stenosis of cervical region 02/05/2020  . Foraminal stenosis of cervical region 02/05/2020  . Other spondylosis with radiculopathy, cervical region 01/15/2020  . Abdominal bloating 06/14/2015  . GERD (gastroesophageal reflux disease) 06/14/2015  . BPH (benign prostatic hypertrophy) 06/14/2015       Past Medical History:  Diagnosis Date  . Anxiety   . GERD (gastroesophageal reflux disease)          Family History  Problem Relation Age of Onset  . Anxiety disorder Mother   . Heart disease Father   . Emphysema Sister   . Diabetes Brother          Past Surgical History:  Procedure Laterality Date  . COLONOSCOPY     5 years ago - Dr. 06/16/2015  . ESOPHAGOGASTRODUODENOSCOPY N/A 06/18/2015   Procedure: ESOPHAGOGASTRODUODENOSCOPY (EGD);  Surgeon: 06/20/2015, MD;  Location: [facility];  Service: Endoscopy;  Laterality: N/A;  155 - moved to 2:40 - [name] notified pt   Social History        Occupational History  . Not  on file  Tobacco Use  . Smoking status: Former Games developer  . Smokeless tobacco: Never Used  Substance and Sexual Activity  . Alcohol use: Yes    Alcohol/week: 0.0 standard drinks    Comment: Social  . Drug use: Not on file  . Sexual activity: Not on file

## 2020-07-28 NOTE — Anesthesia Procedure Notes (Signed)
Procedure Name: Intubation Date/Time: 07/28/2020 12:59 PM Performed by: Waynard Edwards, CRNA Pre-anesthesia Checklist: Patient identified, Emergency Drugs available, Suction available and Patient being monitored Patient Re-evaluated:Patient Re-evaluated prior to induction Oxygen Delivery Method: Circle system utilized Preoxygenation: Pre-oxygenation with 100% oxygen Induction Type: IV induction Ventilation: Mask ventilation without difficulty Laryngoscope Size: Glidescope and 4 Grade View: Grade I Tube type: Oral Tube size: 7.5 mm Number of attempts: 1 Airway Equipment and Method: Rigid stylet and Video-laryngoscopy Placement Confirmation: ETT inserted through vocal cords under direct vision,  positive ETCO2 and breath sounds checked- equal and bilateral Secured at: 24 cm Tube secured with: Tape Dental Injury: Teeth and Oropharynx as per pre-operative assessment  Comments: Elective glidescope intubation due to cervical stenosis.

## 2020-07-28 NOTE — Interval H&P Note (Signed)
History and Physical Interval Note:  07/28/2020 12:36 PM  Corey Frederick  has presented today for surgery, with the diagnosis of c5-6 stenosis, foraminal stenosis.  The various methods of treatment have been discussed with the patient and family. After consideration of risks, benefits and other options for treatment, the patient has consented to  Procedure(s): c5-6 ANTERIOR CERVICAL DECOMPRESSION/DISCECTOMY FUSION, ALLOGRAFT, PLATE (N/A) as a surgical intervention.  The patient's history has been reviewed, patient examined, no change in status, stable for surgery.  I have reviewed the patient's chart and labs.  Questions were answered to the patient's satisfaction.     Eldred Manges

## 2020-07-28 NOTE — OR Nursing (Signed)
Pt ambulated to wheelchair and then to bathroom with steady gait and stand by assist

## 2020-07-28 NOTE — Anesthesia Preprocedure Evaluation (Addendum)
Anesthesia Evaluation  Patient identified by MRN, date of birth, ID band Patient awake    Reviewed: Allergy & Precautions, NPO status , Patient's Chart, lab work & pertinent test results  Airway Mallampati: II  TM Distance: >3 FB Neck ROM: Full    Dental  (+) Teeth Intact, Dental Advisory Given, Caps Upper caps :   Pulmonary neg pulmonary ROS, former smoker,    Pulmonary exam normal breath sounds clear to auscultation       Cardiovascular hypertension, Pt. on medications Normal cardiovascular exam Rhythm:Regular Rate:Normal     Neuro/Psych PSYCHIATRIC DISORDERS Anxiety Depression C5-6 stenosis, foraminal stenosis  Neuromuscular disease    GI/Hepatic Neg liver ROS, GERD  Medicated,  Endo/Other  negative endocrine ROS  Renal/GU negative Renal ROS     Musculoskeletal  (+) Arthritis ,   Abdominal   Peds  Hematology negative hematology ROS (+)   Anesthesia Other Findings Day of surgery medications reviewed with the patient.  Reproductive/Obstetrics                            Anesthesia Physical Anesthesia Plan  ASA: III  Anesthesia Plan: General   Post-op Pain Management:    Induction: Intravenous  PONV Risk Score and Plan: 2 and Midazolam, Dexamethasone and Ondansetron  Airway Management Planned: Oral ETT and Video Laryngoscope Planned  Additional Equipment:   Intra-op Plan:   Post-operative Plan: Extubation in OR  Informed Consent: I have reviewed the patients History and Physical, chart, labs and discussed the procedure including the risks, benefits and alternatives for the proposed anesthesia with the patient or authorized representative who has indicated his/her understanding and acceptance.     Dental advisory given  Plan Discussed with: CRNA  Anesthesia Plan Comments:         Anesthesia Quick Evaluation

## 2020-07-28 NOTE — OR Nursing (Signed)
Pt is awake,alert and oriented.Pt and/or family verbalized understanding of poc and discharge instructions. Reviewed admission and on going care with receiving RN. Pt is in NAD at this time and is ready to be transferred to floor. Will con't to monitor until pt is transferred. Belongings on bed with patient  

## 2020-07-28 NOTE — Discharge Instructions (Addendum)
Ok to shower 1 days postop.  Do not apply any creams or ointments to incision.  Do not remove steri-strips.  You may leave your dressing on until next week when you see Dr. Ophelia Charter.  No aggressive activity. Cervical collar must be on at all times including when showering.  Do not bend or turn neck.  See Dr. Ophelia Charter in one week. 08/05/20  No driving.

## 2020-07-28 NOTE — Transfer of Care (Signed)
Immediate Anesthesia Transfer of Care Note  Patient: MOHID FURUYA  Procedure(s) Performed: [name] ANTERIOR CERVICAL DECOMPRESSION/DISCECTOMY FUSION, ALLOGRAFT, PLATE (N/A )  Patient Location: PACU  Anesthesia Type:General  Level of Consciousness: drowsy and patient cooperative  Airway & Oxygen Therapy: Patient Spontanous Breathing and Patient connected to face mask oxygen  Post-op Assessment: Report given to RN and Post -op Vital signs reviewed and stable. Pt moving extremities x4.  Post vital signs: Reviewed  Last Vitals:  Vitals Value Taken Time  BP 135/99 07/28/20 1434  Temp    Pulse 71 07/28/20 1437  Resp 18 07/28/20 1437  SpO2 100 % 07/28/20 1437  Vitals shown include unvalidated device data.  Last Pain:  Vitals:   07/28/20 1015  TempSrc:   PainSc: 0-No pain      Patients Stated Pain Goal: 2 (07/28/20 1015)  Complications: No complications documented.

## 2020-07-29 ENCOUNTER — Encounter (HOSPITAL_COMMUNITY): Payer: Self-pay | Admitting: Orthopaedic Surgery

## 2020-07-29 DIAGNOSIS — M50122 Cervical disc disorder at C5-C6 level with radiculopathy: Secondary | ICD-10-CM | POA: Diagnosis not present

## 2020-07-29 DIAGNOSIS — I10 Essential (primary) hypertension: Secondary | ICD-10-CM | POA: Diagnosis not present

## 2020-07-29 DIAGNOSIS — M4802 Spinal stenosis, cervical region: Secondary | ICD-10-CM | POA: Diagnosis not present

## 2020-07-29 DIAGNOSIS — M4722 Other spondylosis with radiculopathy, cervical region: Secondary | ICD-10-CM | POA: Diagnosis not present

## 2020-07-29 DIAGNOSIS — Z87891 Personal history of nicotine dependence: Secondary | ICD-10-CM | POA: Diagnosis not present

## 2020-07-29 DIAGNOSIS — Z79899 Other long term (current) drug therapy: Secondary | ICD-10-CM | POA: Diagnosis not present

## 2020-07-29 NOTE — Evaluation (Addendum)
Occupational Therapy Evaluation Patient Details Name: Corey Frederick MRN: [id number] DOB: 1935/07/15 Today's Date: 07/29/2020    History of Present Illness 85 yo male presenting s/p cervical 5-6 discectomy and fusion. PMH including spinal stenosis of cervical region, GERD, and anxiety.   Clinical Impression   PTA, pt was living with his wife and was independent. Currently, pt performing ADLs and functional mobility at Mod I level with increased time and cues for adherance to cervical precautions. Wife present throughout session to optimize carry over to home. Provided education on cervical precautions, bed mobility, grooming, brace management, UB ADLs, LB ADLs, toileting, shower transfer, and stair mobility; pt demonstrated understanding. Answered all pt questions. Recommend dc home once medically stable per physician. All acute OT needs met and will sign off. Thank you.    Follow Up Recommendations  No OT follow up;Supervision/Assistance - 24 hour    Equipment Recommendations  None recommended by OT    Recommendations for Other Services       Precautions / Restrictions Precautions Precautions: Cervical Precaution Booklet Issued: Yes (comment) Precaution Comments: Reviewed handout and education on cervical precautions. Required Braces or Orthoses: Cervical Brace Cervical Brace: Soft collar;At all times      Mobility Bed Mobility Overal bed mobility: Needs Assistance Bed Mobility: Rolling;Sidelying to Sit;Sit to Sidelying Rolling: Supervision Sidelying to sit: Supervision     Sit to sidelying: Supervision General bed mobility comments: Education on log roll techniques; supervision for safety    Transfers Overall transfer level: Modified independent               General transfer comment: Increased time    Balance Overall balance assessment: No apparent balance deficits (not formally assessed)                                         ADL  either performed or assessed with clinical judgement   ADL Overall ADL's : Modified independent                                       General ADL Comments: Pt performing ADLs and functional mobility at Mod I level. Providing education on cervical precautions, compensatory techniques, bed mobility, collar management, UB ADLs, LB ADLs, toileting, shower transfer, and stairs mobility.     Vision Baseline Vision/History: Wears glasses Wears Glasses: At all times Patient Visual Report: No change from baseline       Perception     Praxis      Pertinent Vitals/Pain Pain Assessment: Faces Faces Pain Scale: Hurts a little bit Pain Location: neck Pain Descriptors / Indicators: Discomfort Pain Intervention(s): Monitored during session;Repositioned     Hand Dominance     Extremity/Trunk Assessment Upper Extremity Assessment Upper Extremity Assessment: Generalized weakness   Lower Extremity Assessment Lower Extremity Assessment: Defer to PT evaluation   Cervical / Trunk Assessment Cervical / Trunk Assessment: Other exceptions Cervical / Trunk Exceptions: s/p cervical precaution   Communication Communication Communication: No difficulties   Cognition Arousal/Alertness: Awake/alert Behavior During Therapy: WFL for tasks assessed/performed Overall Cognitive Status: Within Functional Limits for tasks assessed                                 General Comments: ST memory  deficits; feel like this is baseline   General Comments  Wife present throughout session.    Exercises     Shoulder Instructions      Home Living Family/patient expects to be discharged to:: Private residence Living Arrangements: Spouse/significant other Available Help at Discharge: Family;Available 24 hours/day Type of Home: House Home Access: Stairs to enter     Home Layout: Two level Alternate Level Stairs-Number of Steps: flight Alternate Level Stairs-Rails:  Right Bathroom Shower/Tub: Walk-in shower;Tub/shower unit   [employer]: Standard     Home Equipment: Shower seat          Prior Functioning/Environment Level of Independence: Independent                 OT Problem List: Decreased strength;Decreased range of motion;Decreased activity tolerance;Impaired balance (sitting and/or standing);Decreased safety awareness;Decreased knowledge of use of DME or AE;Pain      OT Treatment/Interventions:      OT Goals(Current goals can be found in the care plan section) Acute Rehab OT Goals Patient Stated Goal: Go home OT Goal Formulation: All assessment and education complete, DC therapy  OT Frequency:     Barriers to D/C:            Co-evaluation              AM-PAC OT "6 Clicks" Daily Activity     Outcome Measure Help from another person eating meals?: None Help from another person taking care of personal grooming?: None Help from another person toileting, which includes using toliet, bedpan, or urinal?: None Help from another person bathing (including washing, rinsing, drying)?: None Help from another person to put on and taking off regular upper body clothing?: None Help from another person to put on and taking off regular lower body clothing?: None 6 Click Score: 24   End of Session Equipment Utilized During Treatment: Cervical collar Nurse Communication: Mobility status  Activity Tolerance: Patient tolerated treatment well Patient left: in bed;with call bell/phone within reach;with family/visitor present  OT Visit Diagnosis: Unsteadiness on feet (R26.81);Other abnormalities of gait and mobility (R26.89);Muscle weakness (generalized) (M62.81)                Time: 7588-3254 OT Time Calculation (min): 20 min Charges:  OT General Charges $OT Visit: 1 Visit OT Evaluation $OT Eval Low Complexity: [place], OTR/L Acute Rehab Pager: [phone] Office: [place] 07/29/2020, 12:48 PM

## 2020-07-29 NOTE — Progress Notes (Signed)
Orthopedic Tech Progress Note Patient Details:  Corey Frederick 08-21-1934 [id number]  Ortho Devices Type of Ortho Device: Soft collar Ortho Device/Splint Interventions: Ordered   Post Interventions Patient Tolerated: Other (comment) Instructions Provided: Other (comment)   Michelle Piper 07/29/2020, 9:32 AM

## 2020-07-29 NOTE — Progress Notes (Signed)
   Subjective: 1 Day Post-Op Procedure(s) (LRB): [name] ANTERIOR CERVICAL DECOMPRESSION/DISCECTOMY FUSION, ALLOGRAFT, PLATE (N/A) Patient reports pain as mild.    Objective: Vital signs in last 24 hours: Temp:  [97.1 F (36.2 C)-98.3 F (36.8 C)] 97.7 F (36.5 C) (12/30 0735) Pulse Rate:  [63-85] 85 (12/30 0735) Resp:  [12-18] 17 (12/30 0735) BP: (124-179)/(78-99) 124/84 (12/30 0735) SpO2:  [98 %-100 %] 99 % (12/30 0735) Weight:  [65.8 kg] 65.8 kg (12/29 1008)  Intake/Output from previous day: 12/29 0701 - 12/30 0700 In: 580 [P.O.:480; IV Piggyback:100] Out: 145 [Drains:120; Blood:25] Intake/Output this shift: No intake/output data recorded.  Recent Labs    07/26/20 1400  HGB 15.3   Recent Labs    07/26/20 1400  WBC 6.5  RBC 4.85  HCT 43.7  PLT 221   Recent Labs    07/26/20 1400  NA 139  K 3.5  CL 103  CO2 26  BUN 16  CREATININE 1.20  GLUCOSE 161*  CALCIUM 10.0   No results for input(s): LABPT, INR in the last 72 hours.  Neurologically intact DG Cervical Spine 2-3 Views  Result Date: 07/28/2020 CLINICAL DATA:  C5-C6 ACDF. EXAM: CERVICAL SPINE - 2-3 VIEW; DG C-ARM 1-60 MIN COMPARISON:  MRI of the cervical spine February 03, 2020. FINDINGS: Fluoro time: 6 seconds. Radiation: 0.62 mGy. Two C-arm fluoroscopic images were obtained intraoperatively and submitted for post operative interpretation. These images demonstrate postsurgical changes of C5-C6 ACDF. Please see the performing provider's procedural report for further detail. IMPRESSION: Intraoperative fluoroscopic imaging, as detailed above Electronically Signed   By: Feliberto Harts MD   On: 07/28/2020 15:50   DG C-Arm 1-60 Min  Result Date: 07/28/2020 CLINICAL DATA:  C5-C6 ACDF. EXAM: CERVICAL SPINE - 2-3 VIEW; DG C-ARM 1-60 MIN COMPARISON:  MRI of the cervical spine February 03, 2020. FINDINGS: Fluoro time: 6 seconds. Radiation: 0.62 mGy. Two C-arm fluoroscopic images were obtained intraoperatively  and submitted for post operative interpretation. These images demonstrate postsurgical changes of C5-C6 ACDF. Please see the performing provider's procedural report for further detail. IMPRESSION: Intraoperative fluoroscopic imaging, as detailed above Electronically Signed   By: Feliberto Harts MD   On: 07/28/2020 15:50    Assessment/Plan: 1 Day Post-Op Procedure(s) (LRB): [name] ANTERIOR CERVICAL DECOMPRESSION/DISCECTOMY FUSION, ALLOGRAFT, PLATE (N/A) Plan: discharge home office one week.  Corey Frederick 07/29/2020, 7:47 AM

## 2020-07-29 NOTE — Progress Notes (Signed)
Patient is discharged from room 3C07 at this time. Alert and in stable condition. IV site d/'d as well as hemovac removed. Instructions read to patient and spouse with understanding verbalized. Left unit via wheelchair with all belongings at side.

## 2020-07-30 NOTE — Anesthesia Postprocedure Evaluation (Signed)
Anesthesia Post Note  Patient: JARIAH JARMON  Procedure(s) Performed: [name] ANTERIOR CERVICAL DECOMPRESSION/DISCECTOMY FUSION, ALLOGRAFT, PLATE (N/A )     Patient location during evaluation: PACU Anesthesia Type: General Level of consciousness: awake and alert Pain management: pain level controlled Vital Signs Assessment: post-procedure vital signs reviewed and stable Respiratory status: spontaneous breathing, nonlabored ventilation, respiratory function stable and patient connected to nasal cannula oxygen Cardiovascular status: blood pressure returned to baseline and stable Postop Assessment: no apparent nausea or vomiting Anesthetic complications: no   No complications documented.  Last Vitals:  Vitals:   07/29/20 0338 07/29/20 0735  BP: 137/88 124/84  Pulse: 75 85  Resp: 18 17  Temp: 36.4 C 36.5 C  SpO2: 100% 99%    Last Pain:  Vitals:   07/29/20 1025  TempSrc:   PainSc: 3    Pain Goal: Patients Stated Pain Goal: 3 (07/29/20 0559)                 Kennieth Rad

## 2020-08-05 ENCOUNTER — Ambulatory Visit (INDEPENDENT_AMBULATORY_CARE_PROVIDER_SITE_OTHER): Payer: Medicare PPO | Admitting: Orthopaedic Surgery

## 2020-08-05 ENCOUNTER — Ambulatory Visit (INDEPENDENT_AMBULATORY_CARE_PROVIDER_SITE_OTHER): Payer: Medicare PPO

## 2020-08-05 ENCOUNTER — Encounter: Payer: Self-pay | Admitting: Orthopaedic Surgery

## 2020-08-05 VITALS — Ht 70.0 in | Wt 145.0 lb

## 2020-08-05 DIAGNOSIS — Z981 Arthrodesis status: Secondary | ICD-10-CM | POA: Diagnosis not present

## 2020-08-05 NOTE — Progress Notes (Signed)
   Post-Op Visit Note   Patient: Corey Frederick           Date of Birth: November 17, 1934           MRN: [id number] Visit Date: 08/05/2020 PCP: Benita Stabile, MD   Assessment & Plan: Post C5-6 fusion.  He is taking minimal pain medication incision looks good extra collar coverage given.  Continue soft collar return 5 weeks.  Lateral flexion-extension cervical spine x-ray on return.  Chief Complaint:  Chief Complaint  Patient presents with  . Neck - Routine Post Op    07/28/2020 C5-6 ACDF   Visit Diagnoses:  1. Status post cervical spinal fusion     Plan: ROV 5 wks.   Follow-Up Instructions: Return in about 5 weeks (around 09/09/2020).   Orders:  Orders Placed This Encounter  Procedures  . XR Cervical Spine 2 or 3 views   No orders of the defined types were placed in this encounter.   Imaging: No results found.  PMFS History: Patient Active Problem List   Diagnosis Date Noted  . Cervical spinal stenosis 07/28/2020  . Spinal stenosis of cervical region 02/05/2020  . Foraminal stenosis of cervical region 02/05/2020  . Other spondylosis with radiculopathy, cervical region 01/15/2020  . Abdominal bloating 06/14/2015  . GERD (gastroesophageal reflux disease) 06/14/2015  . BPH (benign prostatic hypertrophy) 06/14/2015   Past Medical History:  Diagnosis Date  . Anxiety   . Depression   . GERD (gastroesophageal reflux disease)   . Hypertension   . Pre-diabetes     Family History  Problem Relation Age of Onset  . Anxiety disorder Mother   . Heart disease Father   . Emphysema Sister   . Diabetes Brother     Past Surgical History:  Procedure Laterality Date  . ANTERIOR CERVICAL DECOMP/DISCECTOMY FUSION N/A 07/28/2020   Procedure: [name] ANTERIOR CERVICAL DECOMPRESSION/DISCECTOMY FUSION, ALLOGRAFT, PLATE;  Surgeon: Eldred Manges, MD;  Location: [facility];  Service: Orthopedics;  Laterality: N/A;  . COLONOSCOPY     5 years ago - Dr. Darrick Penna  .  ESOPHAGOGASTRODUODENOSCOPY N/A 06/18/2015   Procedure: ESOPHAGOGASTRODUODENOSCOPY (EGD);  Surgeon: Malissa Hippo, MD;  Location: [facility];  Service: Endoscopy;  Laterality: N/A;  155 - moved to 2:40 - [name] notified pt   Social History   Occupational History  . Not on file  Tobacco Use  . Smoking status: Former Games developer  . Smokeless tobacco: Never Used  Vaping Use  . Vaping Use: Never used  Substance and Sexual Activity  . Alcohol use: Yes    Alcohol/week: 0.0 standard drinks    Comment: Social  . Drug use: Never  . Sexual activity: Not on file

## 2020-08-06 NOTE — Discharge Summary (Signed)
Patient ID: Corey Frederick MRN: [id number] DOB/AGE: 03-28-1935 86 y.o.  Admit date: 07/28/2020 Discharge date: 07/29/2020 Admission Diagnoses:  Active Problems:   Other spondylosis with radiculopathy, cervical region   Cervical spinal stenosis   Discharge Diagnoses:  Active Problems:   Other spondylosis with radiculopathy, cervical region   Cervical spinal stenosis  status post Procedure(s): [name] ANTERIOR CERVICAL DECOMPRESSION/DISCECTOMY FUSION, ALLOGRAFT, PLATE  Past Medical History:  Diagnosis Date  . Anxiety   . Depression   . GERD (gastroesophageal reflux disease)   . Hypertension   . Pre-diabetes     Surgeries: Procedure(s): [name] ANTERIOR CERVICAL DECOMPRESSION/DISCECTOMY FUSION, ALLOGRAFT, PLATE on 87/56/4332   Consultants:   Discharged Condition: Improved  Hospital Course: Corey Frederick is an 86 y.o. male who was admitted 07/28/2020 for operative treatment of cervical HNP/stenosis. Patient failed conservative treatments (please see the history and physical for the specifics) and had severe unremitting pain that affects sleep, daily activities and work/hobbies. After pre-op clearance, the patient was taken to the operating room on 07/28/2020 and underwent  Procedure(s): [name] ANTERIOR CERVICAL DECOMPRESSION/DISCECTOMY FUSION, ALLOGRAFT, PLATE.    Patient was given perioperative antibiotics:  Anti-infectives (From admission, onward)   Start     Dose/Rate Route Frequency Ordered Stop   07/28/20 1045  ceFAZolin (ANCEF) IVPB 2g/100 mL premix        2 g 200 mL/hr over 30 Minutes Intravenous On call to O.R. 07/28/20 1041 07/28/20 1335       Patient was given sequential compression devices and early ambulation to prevent DVT.   Patient benefited maximally from hospital stay and there were no complications. At the time of discharge, the patient was urinating/moving their bowels without difficulty, tolerating a regular  diet, pain is controlled with oral pain medications and they have been cleared by PT/OT.   Recent vital signs: No data found.   Recent laboratory studies: No results for input(s): WBC, HGB, HCT, PLT, NA, K, CL, CO2, BUN, CREATININE, GLUCOSE, INR, CALCIUM in the last 72 hours.  Invalid input(s): PT, 2   Discharge Medications:   Allergies as of 07/29/2020   No Known Allergies     Medication List    STOP taking these medications   ibuprofen 200 MG tablet Commonly known as: ADVIL     TAKE these medications   FLUoxetine 10 MG capsule Commonly known as: PROZAC Take 10 mg by mouth daily.   HYDROcodone-acetaminophen 10-325 MG tablet Commonly known as: Norco Take 1 tablet by mouth every 6 (six) hours as needed.   LORazepam 1 MG tablet Commonly known as: ATIVAN Take 1 mg by mouth at bedtime.   pantoprazole 40 MG tablet Commonly known as: PROTONIX Take 40 mg by mouth daily.   ramipril 5 MG capsule Commonly known as: ALTACE Take 5 mg by mouth daily.   rosuvastatin 10 MG tablet Commonly known as: CRESTOR Take 10 mg by mouth daily.   Vitamin D (Cholecalciferol) 25 MCG (1000 UT) Caps Take 1,000 Units by mouth daily.       Diagnostic Studies: DG Cervical Spine 2-3 Views  Result Date: 07/28/2020 CLINICAL DATA:  C5-C6 ACDF. EXAM: CERVICAL SPINE - 2-3 VIEW; DG C-ARM 1-60 MIN COMPARISON:  MRI of the cervical spine February 03, 2020. FINDINGS: Fluoro time: 6 seconds. Radiation: 0.62 mGy. Two C-arm fluoroscopic images were obtained intraoperatively and submitted for post operative interpretation. These images demonstrate postsurgical changes of C5-C6 ACDF. Please see the performing provider's procedural report for further detail. IMPRESSION:  Intraoperative fluoroscopic imaging, as detailed above Electronically Signed   By: Feliberto Harts MD   On: 07/28/2020 15:50   DG C-Arm 1-60 Min  Result Date: 07/28/2020 CLINICAL DATA:  C5-C6 ACDF. EXAM: CERVICAL SPINE - 2-3 VIEW; DG C-ARM 1-60  MIN COMPARISON:  MRI of the cervical spine February 03, 2020. FINDINGS: Fluoro time: 6 seconds. Radiation: 0.62 mGy. Two C-arm fluoroscopic images were obtained intraoperatively and submitted for post operative interpretation. These images demonstrate postsurgical changes of C5-C6 ACDF. Please see the performing provider's procedural report for further detail. IMPRESSION: Intraoperative fluoroscopic imaging, as detailed above Electronically Signed   By: Feliberto Harts MD   On: 07/28/2020 15:50   XR Cervical Spine 2 or 3 views  Result Date: 08/05/2020 [facility] lateral cervical spine images were obtained and reviewed this shows C5-6 fusion with plate allograft and screws.  Good position alignment. Impression: SatisfactoryC5-6 ACDF images.    Discharge Instructions    Incentive spirometry RT   Complete by: As directed        Follow-up Information    Schedule an appointment as soon as possible for a visit with Eldred Manges, MD.   Specialty: Orthopedic Surgery Why: need return office visit one week postop with dr Ophelia Charter.  Contact information: [address] [place] [address] [phone]               Discharge Plan:  discharge to home  Disposition:     Signed: Zonia Kief  08/06/2020, 10:57 AM

## 2020-08-12 DIAGNOSIS — H2513 Age-related nuclear cataract, bilateral: Secondary | ICD-10-CM | POA: Diagnosis not present

## 2020-09-02 ENCOUNTER — Encounter: Payer: Self-pay | Admitting: Orthopaedic Surgery

## 2020-09-02 ENCOUNTER — Other Ambulatory Visit: Payer: Self-pay

## 2020-09-02 ENCOUNTER — Ambulatory Visit (INDEPENDENT_AMBULATORY_CARE_PROVIDER_SITE_OTHER): Payer: Medicare PPO | Admitting: Orthopaedic Surgery

## 2020-09-02 ENCOUNTER — Ambulatory Visit (INDEPENDENT_AMBULATORY_CARE_PROVIDER_SITE_OTHER): Payer: Medicare PPO

## 2020-09-02 VITALS — Ht 70.0 in | Wt 145.0 lb

## 2020-09-02 DIAGNOSIS — Z981 Arthrodesis status: Secondary | ICD-10-CM | POA: Diagnosis not present

## 2020-09-02 NOTE — Progress Notes (Signed)
   Post-Op Visit Note   Patient: Corey Frederick           Date of Birth: 06-02-1935           MRN: [id number] Visit Date: 09/02/2020 PCP: Benita Stabile, MD   Assessment & Plan: 5 weeks post single level fusion.  He can discontinue collar in 6 days.  Good relief of preop neck arm pain and hand numbness.  He is happy with the surgical result and will return as needed.  Chief Complaint:  Chief Complaint  Patient presents with  . Neck - Follow-up    07/28/2020 C5-6 ACDF   Visit Diagnoses:  1. Status post cervical spinal fusion     Plan: R OV as needed  Follow-Up Instructions: No follow-ups on file.   Orders:  Orders Placed This Encounter  Procedures  . XR Cervical Spine 2 or 3 views   No orders of the defined types were placed in this encounter.   Imaging: No results found.  PMFS History: Patient Active Problem List   Diagnosis Date Noted  . Cervical spinal stenosis 07/28/2020  . Spinal stenosis of cervical region 02/05/2020  . Foraminal stenosis of cervical region 02/05/2020  . Other spondylosis with radiculopathy, cervical region 01/15/2020  . Abdominal bloating 06/14/2015  . GERD (gastroesophageal reflux disease) 06/14/2015  . BPH (benign prostatic hypertrophy) 06/14/2015   Past Medical History:  Diagnosis Date  . Anxiety   . Depression   . GERD (gastroesophageal reflux disease)   . Hypertension   . Pre-diabetes     Family History  Problem Relation Age of Onset  . Anxiety disorder Mother   . Heart disease Father   . Emphysema Sister   . Diabetes Brother     Past Surgical History:  Procedure Laterality Date  . ANTERIOR CERVICAL DECOMP/DISCECTOMY FUSION N/A 07/28/2020   Procedure: [name] ANTERIOR CERVICAL DECOMPRESSION/DISCECTOMY FUSION, ALLOGRAFT, PLATE;  Surgeon: Eldred Manges, MD;  Location: [facility] OR;  Service: Orthopedics;  Laterality: N/A;  . COLONOSCOPY     5 years ago - Dr. Darrick Penna  . ESOPHAGOGASTRODUODENOSCOPY N/A 06/18/2015    Procedure: ESOPHAGOGASTRODUODENOSCOPY (EGD);  Surgeon: Malissa Hippo, MD;  Location: [facility];  Service: Endoscopy;  Laterality: N/A;  155 - moved to 2:40 - [name] notified pt   Social History   Occupational History  . Not on file  Tobacco Use  . Smoking status: Former Games developer  . Smokeless tobacco: Never Used  Vaping Use  . Vaping Use: Never used  Substance and Sexual Activity  . Alcohol use: Yes    Alcohol/week: 0.0 standard drinks    Comment: Social  . Drug use: Never  . Sexual activity: Not on file

## 2020-10-14 ENCOUNTER — Ambulatory Visit (INDEPENDENT_AMBULATORY_CARE_PROVIDER_SITE_OTHER): Payer: Medicare PPO | Admitting: Orthopaedic Surgery

## 2020-10-14 ENCOUNTER — Ambulatory Visit: Payer: Self-pay

## 2020-10-14 ENCOUNTER — Other Ambulatory Visit: Payer: Self-pay

## 2020-10-14 DIAGNOSIS — Z981 Arthrodesis status: Secondary | ICD-10-CM

## 2020-10-14 NOTE — Progress Notes (Signed)
   Post-Op Visit Note   Patient: Corey Frederick           Date of Birth: 05/09/1935           MRN: [id number] Visit Date: 10/14/2020 PCP: Benita Stabile, MD   Assessment & Plan: Post C5-6 fusion.  Patient noticed some clicking in his neck and wanted x-rays.  This shows progressive incorporation of the graft good position of graft and screws with consolidation of the fusion.  Patient was reassured that is normal did have some sound in his neck when he turns it.  Good relief of arm weakness and numbness.  He can resume regular workout activities including light weights.  Follow-up as needed.  Chief Complaint:  Chief Complaint  Patient presents with  . Neck - Pain   Visit Diagnoses:  1. Status post cervical spinal fusion     Plan:return PRN  Follow-Up Instructions: No follow-ups on file.   Orders:  Orders Placed This Encounter  Procedures  . XR Cervical Spine 2 or 3 views   No orders of the defined types were placed in this encounter.   Imaging: No results found.  PMFS History: Patient Active Problem List   Diagnosis Date Noted  . Cervical spinal stenosis 07/28/2020  . Spinal stenosis of cervical region 02/05/2020  . Foraminal stenosis of cervical region 02/05/2020  . Other spondylosis with radiculopathy, cervical region 01/15/2020  . Abdominal bloating 06/14/2015  . GERD (gastroesophageal reflux disease) 06/14/2015  . BPH (benign prostatic hypertrophy) 06/14/2015   Past Medical History:  Diagnosis Date  . Anxiety   . Depression   . GERD (gastroesophageal reflux disease)   . Hypertension   . Pre-diabetes     Family History  Problem Relation Age of Onset  . Anxiety disorder Mother   . Heart disease Father   . Emphysema Sister   . Diabetes Brother     Past Surgical History:  Procedure Laterality Date  . ANTERIOR CERVICAL DECOMP/DISCECTOMY FUSION N/A 07/28/2020   Procedure: [name] ANTERIOR CERVICAL DECOMPRESSION/DISCECTOMY FUSION, ALLOGRAFT, PLATE;   Surgeon: Eldred Manges, MD;  Location: [facility] OR;  Service: Orthopedics;  Laterality: N/A;  . COLONOSCOPY     5 years ago - Dr. Darrick Penna  . ESOPHAGOGASTRODUODENOSCOPY N/A 06/18/2015   Procedure: ESOPHAGOGASTRODUODENOSCOPY (EGD);  Surgeon: Malissa Hippo, MD;  Location: [facility];  Service: Endoscopy;  Laterality: N/A;  155 - moved to 2:40 - [name] notified pt   Social History   Occupational History  . Not on file  Tobacco Use  . Smoking status: Former Games developer  . Smokeless tobacco: Never Used  Vaping Use  . Vaping Use: Never used  Substance and Sexual Activity  . Alcohol use: Yes    Alcohol/week: 0.0 standard drinks    Comment: Social  . Drug use: Never  . Sexual activity: Not on file

## 2020-11-02 DIAGNOSIS — F411 Generalized anxiety disorder: Secondary | ICD-10-CM | POA: Diagnosis not present

## 2020-11-02 DIAGNOSIS — N400 Enlarged prostate without lower urinary tract symptoms: Secondary | ICD-10-CM | POA: Diagnosis not present

## 2020-11-02 DIAGNOSIS — R234 Changes in skin texture: Secondary | ICD-10-CM | POA: Diagnosis not present

## 2020-11-02 DIAGNOSIS — M542 Cervicalgia: Secondary | ICD-10-CM | POA: Diagnosis not present

## 2020-11-02 DIAGNOSIS — Z712 Person consulting for explanation of examination or test findings: Secondary | ICD-10-CM | POA: Diagnosis not present

## 2020-11-02 DIAGNOSIS — Z0000 Encounter for general adult medical examination without abnormal findings: Secondary | ICD-10-CM | POA: Diagnosis not present

## 2020-11-02 DIAGNOSIS — Z23 Encounter for immunization: Secondary | ICD-10-CM | POA: Diagnosis not present

## 2020-11-02 DIAGNOSIS — M546 Pain in thoracic spine: Secondary | ICD-10-CM | POA: Diagnosis not present

## 2020-11-02 DIAGNOSIS — Z0189 Encounter for other specified special examinations: Secondary | ICD-10-CM | POA: Diagnosis not present

## 2020-11-02 DIAGNOSIS — Z0001 Encounter for general adult medical examination with abnormal findings: Secondary | ICD-10-CM | POA: Diagnosis not present

## 2020-11-30 DIAGNOSIS — Z712 Person consulting for explanation of examination or test findings: Secondary | ICD-10-CM | POA: Diagnosis not present

## 2020-11-30 DIAGNOSIS — M542 Cervicalgia: Secondary | ICD-10-CM | POA: Diagnosis not present

## 2020-11-30 DIAGNOSIS — Z0000 Encounter for general adult medical examination without abnormal findings: Secondary | ICD-10-CM | POA: Diagnosis not present

## 2020-11-30 DIAGNOSIS — N400 Enlarged prostate without lower urinary tract symptoms: Secondary | ICD-10-CM | POA: Diagnosis not present

## 2020-11-30 DIAGNOSIS — Z0189 Encounter for other specified special examinations: Secondary | ICD-10-CM | POA: Diagnosis not present

## 2020-11-30 DIAGNOSIS — R234 Changes in skin texture: Secondary | ICD-10-CM | POA: Diagnosis not present

## 2020-11-30 DIAGNOSIS — M546 Pain in thoracic spine: Secondary | ICD-10-CM | POA: Diagnosis not present

## 2020-11-30 DIAGNOSIS — Z0001 Encounter for general adult medical examination with abnormal findings: Secondary | ICD-10-CM | POA: Diagnosis not present

## 2020-11-30 DIAGNOSIS — Z23 Encounter for immunization: Secondary | ICD-10-CM | POA: Diagnosis not present

## 2021-01-26 ENCOUNTER — Telehealth: Payer: Self-pay | Admitting: Orthopaedic Surgery

## 2021-01-26 NOTE — Telephone Encounter (Signed)
Please advise 

## 2021-01-26 NOTE — Telephone Encounter (Signed)
Patient called asked if he can lift weights over his head. Patient wants to go back to exercising. The number to contact patient is [phone]

## 2021-01-27 NOTE — Telephone Encounter (Signed)
I called to advise. No answer, no voicemail.

## 2021-01-27 NOTE — Telephone Encounter (Signed)
Patient came in to [facility] office and was told this is fine.

## 2021-03-28 DIAGNOSIS — U071 COVID-19: Secondary | ICD-10-CM | POA: Diagnosis not present

## 2021-04-07 DIAGNOSIS — U099 Post covid-19 condition, unspecified: Secondary | ICD-10-CM | POA: Diagnosis not present

## 2021-04-07 DIAGNOSIS — H6123 Impacted cerumen, bilateral: Secondary | ICD-10-CM | POA: Diagnosis not present

## 2021-04-07 DIAGNOSIS — E1169 Type 2 diabetes mellitus with other specified complication: Secondary | ICD-10-CM | POA: Diagnosis not present

## 2021-04-07 DIAGNOSIS — Z23 Encounter for immunization: Secondary | ICD-10-CM | POA: Diagnosis not present

## 2021-04-07 DIAGNOSIS — R634 Abnormal weight loss: Secondary | ICD-10-CM | POA: Diagnosis not present

## 2021-04-12 DIAGNOSIS — E1169 Type 2 diabetes mellitus with other specified complication: Secondary | ICD-10-CM | POA: Diagnosis not present

## 2021-04-14 DIAGNOSIS — E1169 Type 2 diabetes mellitus with other specified complication: Secondary | ICD-10-CM | POA: Diagnosis not present

## 2021-04-14 DIAGNOSIS — I129 Hypertensive chronic kidney disease with stage 1 through stage 4 chronic kidney disease, or unspecified chronic kidney disease: Secondary | ICD-10-CM | POA: Diagnosis not present

## 2021-04-14 DIAGNOSIS — U099 Post covid-19 condition, unspecified: Secondary | ICD-10-CM | POA: Diagnosis not present

## 2021-04-14 DIAGNOSIS — R634 Abnormal weight loss: Secondary | ICD-10-CM | POA: Diagnosis not present

## 2021-04-14 DIAGNOSIS — E782 Mixed hyperlipidemia: Secondary | ICD-10-CM | POA: Diagnosis not present

## 2021-04-14 DIAGNOSIS — M542 Cervicalgia: Secondary | ICD-10-CM | POA: Diagnosis not present

## 2021-04-14 DIAGNOSIS — N400 Enlarged prostate without lower urinary tract symptoms: Secondary | ICD-10-CM | POA: Diagnosis not present

## 2021-04-14 DIAGNOSIS — Z0001 Encounter for general adult medical examination with abnormal findings: Secondary | ICD-10-CM | POA: Diagnosis not present

## 2021-04-14 DIAGNOSIS — Z23 Encounter for immunization: Secondary | ICD-10-CM | POA: Diagnosis not present

## 2021-05-17 ENCOUNTER — Emergency Department (HOSPITAL_COMMUNITY): Payer: Medicare PPO

## 2021-05-17 ENCOUNTER — Emergency Department (HOSPITAL_COMMUNITY)
Admission: EM | Admit: 2021-05-17 | Discharge: 2021-05-18 | Disposition: A | Discharge status: Home / Self Care | Payer: Medicare PPO | Attending: Emergency Medicine | Admitting: Emergency Medicine

## 2021-05-17 ENCOUNTER — Encounter (HOSPITAL_COMMUNITY): Payer: Self-pay

## 2021-05-17 DIAGNOSIS — I10 Essential (primary) hypertension: Secondary | ICD-10-CM | POA: Diagnosis not present

## 2021-05-17 DIAGNOSIS — Y929 Unspecified place or not applicable: Secondary | ICD-10-CM | POA: Diagnosis not present

## 2021-05-17 DIAGNOSIS — W010XXA Fall on same level from slipping, tripping and stumbling without subsequent striking against object, initial encounter: Secondary | ICD-10-CM | POA: Insufficient documentation

## 2021-05-17 DIAGNOSIS — Z87891 Personal history of nicotine dependence: Secondary | ICD-10-CM | POA: Insufficient documentation

## 2021-05-17 DIAGNOSIS — Y999 Unspecified external cause status: Secondary | ICD-10-CM | POA: Insufficient documentation

## 2021-05-17 DIAGNOSIS — Y9301 Activity, walking, marching and hiking: Secondary | ICD-10-CM | POA: Diagnosis not present

## 2021-05-17 DIAGNOSIS — W19XXXA Unspecified fall, initial encounter: Secondary | ICD-10-CM

## 2021-05-17 DIAGNOSIS — Z043 Encounter for examination and observation following other accident: Secondary | ICD-10-CM | POA: Diagnosis not present

## 2021-05-17 DIAGNOSIS — R519 Headache, unspecified: Secondary | ICD-10-CM | POA: Diagnosis not present

## 2021-05-17 DIAGNOSIS — S0083XA Contusion of other part of head, initial encounter: Secondary | ICD-10-CM | POA: Insufficient documentation

## 2021-05-17 DIAGNOSIS — R55 Syncope and collapse: Secondary | ICD-10-CM | POA: Diagnosis not present

## 2021-05-17 DIAGNOSIS — S0990XA Unspecified injury of head, initial encounter: Secondary | ICD-10-CM | POA: Diagnosis present

## 2021-05-17 LAB — CBG MONITORING, ED: Glucose-Capillary: 99 mg/dL (ref 70–99)

## 2021-05-17 NOTE — ED Triage Notes (Signed)
Pov from home with cc of fall. He has a hematoma to the right forehead. Says he fell when he went to get up to get the phone. Unsure if he had LOC but its possible.

## 2021-05-17 NOTE — ED Provider Notes (Signed)
[facility] EMERGENCY DEPARTMENT Provider Note   CSN: [id number] Arrival date & time: 05/17/21  2223     History Chief Complaint  Patient presents with   Corey Frederick    Corey Frederick is a 86 y.o. male.  86 year old male with medical problems documented below who presents emerged from today after a fall.  Patient does not remember the fall.  He is here with his relative who said that the patient has started to walk across the bedroom and had a fall and then remembers being on the ground but does not remember falling.  Patient states he has a headache related to a contusion on his forehead but no other injuries.  Patient does not have a history of syncope.  Unsure if he syncopized this time.   Fall      Past Medical History:  Diagnosis Date   Anxiety    Depression    GERD (gastroesophageal reflux disease)    Hypertension    Pre-diabetes     Patient Active Problem List   Diagnosis Date Noted   Cervical spinal stenosis 07/28/2020   Spinal stenosis of cervical region 02/05/2020   Foraminal stenosis of cervical region 02/05/2020   Other spondylosis with radiculopathy, cervical region 01/15/2020   Abdominal bloating 06/14/2015   GERD (gastroesophageal reflux disease) 06/14/2015   BPH (benign prostatic hypertrophy) 06/14/2015    Past Surgical History:  Procedure Laterality Date   ANTERIOR CERVICAL DECOMP/DISCECTOMY FUSION N/A 07/28/2020   Procedure: [name] ANTERIOR CERVICAL DECOMPRESSION/DISCECTOMY FUSION, ALLOGRAFT, PLATE;  Surgeon: Eldred Manges, MD;  Location: [facility] OR;  Service: Orthopedics;  Laterality: N/A;   COLONOSCOPY     5 years ago - Dr. Darrick Penna   ESOPHAGOGASTRODUODENOSCOPY N/A 06/18/2015   Procedure: ESOPHAGOGASTRODUODENOSCOPY (EGD);  Surgeon: Malissa Hippo, MD;  Location: [facility];  Service: Endoscopy;  Laterality: N/A;  155 - moved to 2:40 - [name] notified pt       Family History  Problem Relation Age of Onset   Anxiety disorder Mother     Heart disease Father    Emphysema Sister    Diabetes Brother     Social History   Tobacco Use   Smoking status: Former   Smokeless tobacco: Never  Building services engineer Use: Never used  Substance Use Topics   Alcohol use: Yes    Alcohol/week: 0.0 standard drinks    Comment: Social   Drug use: Never    Home Medications Prior to Admission medications   Medication Sig Start Date End Date Taking? Authorizing Provider  FLUoxetine (PROZAC) 10 MG capsule Take 10 mg by mouth daily. 09/15/19   [provider]  HYDROcodone-acetaminophen (NORCO) 10-325 MG tablet Take 1 tablet by mouth every 6 (six) hours as needed. Patient not taking: No sig reported 07/28/20   Naida Sleight, PA-C  LORazepam (ATIVAN) 1 MG tablet Take 1 mg by mouth at bedtime.    [provider]  pantoprazole (PROTONIX) 40 MG tablet Take 40 mg by mouth daily.    [provider]  ramipril (ALTACE) 5 MG capsule Take 5 mg by mouth daily.    [provider]  rosuvastatin (CRESTOR) 10 MG tablet Take 10 mg by mouth daily. 11/17/19   [provider]  Vitamin D, Cholecalciferol, 1000 UNITS CAPS Take 1,000 Units by mouth daily.    [provider]    Allergies    Patient has no known allergies.  Review of Systems   Review of Systems  All other systems reviewed and are negative.  Physical Exam Updated Vital Signs BP 136/74   Pulse 85   Temp 98.1 F (36.7 C) (Oral)   Resp 19   Ht 5\' 10"  (1.778 m)   Wt 60.3 kg   SpO2 99%   BMI 19.08 kg/m   Physical Exam Vitals and nursing note reviewed.  Constitutional:      Appearance: He is well-developed.  HENT:     Head: Normocephalic.     Comments: Hematoma to right forehead    Mouth/Throat:     Mouth: Mucous membranes are moist.     Pharynx: Oropharynx is clear.  Eyes:     Pupils: Pupils are equal, round, and reactive to light.  Cardiovascular:     Rate and Rhythm: Normal rate.  Pulmonary:     Effort: Pulmonary effort  is normal. No respiratory distress.  Abdominal:     General: Abdomen is flat. There is no distension.  Musculoskeletal:        General: Normal range of motion.     Cervical back: Normal range of motion.  Skin:    General: Skin is warm and dry.  Neurological:     General: No focal deficit present.     Mental Status: He is alert.    ED Results / Procedures / Treatments   Labs (all labs ordered are listed, but only abnormal results are displayed) Labs Reviewed  COMPREHENSIVE METABOLIC PANEL - Abnormal; Notable for the following components:      Result Value   Glucose, Bld 106 (*)    All other components within normal limits  CBC WITH DIFFERENTIAL/PLATELET  CBG MONITORING, ED    EKG None  Radiology DG Chest 2 View  Result Date: 05/17/2021 CLINICAL DATA:  Fall EXAM: CHEST - 2 VIEW COMPARISON:  None. FINDINGS: Lungs are clear.  No pleural effusion or pneumothorax. The heart is normal in size. Visualized osseous structures are within normal limits. IMPRESSION: Normal chest radiographs. Electronically Signed   By: 05/19/2021 M.D.   On: 05/17/2021 23:45   CT Head Wo Contrast  Result Date: 05/17/2021 CLINICAL DATA:  Fall, right forehead hematoma, headache EXAM: CT HEAD WITHOUT CONTRAST TECHNIQUE: Contiguous axial images were obtained from the base of the skull through the vertex without intravenous contrast. COMPARISON:  None. FINDINGS: Brain: No evidence of acute infarction, hemorrhage, hydrocephalus, extra-axial collection or mass lesion/mass effect. Subcortical white matter and periventricular small vessel ischemic changes. Mild age related cortical atrophy. Vascular: No hyperdense vessel or unexpected calcification. Skull: Normal. Negative for fracture or focal lesion. Sinuses/Orbits: The visualized paranasal sinuses are essentially clear. The mastoid air cells are unopacified. Other: Two small extracranial hematomas along the right frontal bone (series 2/images and 18 and 22).  IMPRESSION: Two small extracranial hematomas along the right frontal bone. No evidence of calvarial fracture. No evidence of acute intracranial abnormality. Atrophy with small vessel ischemic changes. Electronically Signed   By: 05/19/2021 M.D.   On: 05/17/2021 23:40    Procedures Procedures   Medications Ordered in ED Medications - No data to display  ED Course  I have reviewed the triage vital signs and the nursing notes.  Pertinent labs & imaging results that were available during my care of the patient were reviewed by me and considered in my medical decision making (see chart for details).    MDM Rules/Calculators/A&P  Pateint with a fall and no obvious related injuries aside from cotnusion.  Possible syncope vs anterograde amnesia. Labs done to eval for red flags of syncope and overall look reassuring. Ambulates. Eats. Asymptomatic aside from headache. Stable for discharge.    Final Clinical Impression(s) / ED Diagnoses Final diagnoses:  Fall, initial encounter  Contusion of face, initial encounter  Syncope, unspecified syncope type    Rx / DC Orders ED Discharge Orders     None        Dreyton Roessner, Barbara Cower, MD 05/18/21 0144

## 2021-05-18 LAB — COMPREHENSIVE METABOLIC PANEL
ALT: 25 U/L (ref 0–44)
AST: 27 U/L (ref 15–41)
Albumin: 4 g/dL (ref 3.5–5.0)
Alkaline Phosphatase: 45 U/L (ref 38–126)
Anion gap: 8 (ref 5–15)
BUN: 17 mg/dL (ref 8–23)
CO2: 27 mmol/L (ref 22–32)
Calcium: 9.5 mg/dL (ref 8.9–10.3)
Chloride: 104 mmol/L (ref 98–111)
Creatinine, Ser: 1.17 mg/dL (ref 0.61–1.24)
GFR, Estimated: >60 mL/min (ref >60)
Glucose, Bld: 106 mg/dL — ABNORMAL HIGH (ref 70–99)
Potassium: 4.1 mmol/L (ref 3.5–5.1)
Sodium: 139 mmol/L (ref 135–145)
Total Bilirubin: 1.1 mg/dL (ref 0.3–1.2)
Total Protein: 7 g/dL (ref 6.5–8.1)

## 2021-05-18 LAB — CBC WITH DIFFERENTIAL/PLATELET
Abs Immature Granulocytes: 0.02 10*3/uL (ref 0.00–0.07)
Basophils Absolute: 0.1 10*3/uL (ref 0.0–0.1)
Basophils Relative: 1 %
Eosinophils Absolute: 0.1 10*3/uL (ref 0.0–0.5)
Eosinophils Relative: 2 %
HCT: 40.9 % (ref 39.0–52.0)
Hemoglobin: 14.1 g/dL (ref 13.0–17.0)
Immature Granulocytes: 0 %
Lymphocytes Relative: 34 %
Lymphs Abs: 1.9 10*3/uL (ref 0.7–4.0)
MCH: 32.9 pg (ref 26.0–34.0)
MCHC: 34.5 g/dL (ref 30.0–36.0)
MCV: 95.3 fL (ref 80.0–100.0)
Monocytes Absolute: 0.5 10*3/uL (ref 0.1–1.0)
Monocytes Relative: 9 %
Neutro Abs: 3.1 10*3/uL (ref 1.7–7.7)
Neutrophils Relative %: 54 %
Platelets: 221 10*3/uL (ref 150–400)
RBC: 4.29 MIL/uL (ref 4.22–5.81)
RDW: 14.2 % (ref 11.5–15.5)
WBC: 5.6 10*3/uL (ref 4.0–10.5)
nRBC: 0 % (ref 0.0–0.2)

## 2021-05-19 DIAGNOSIS — H01002 Unspecified blepharitis right lower eyelid: Secondary | ICD-10-CM | POA: Diagnosis not present

## 2021-06-03 DIAGNOSIS — G252 Other specified forms of tremor: Secondary | ICD-10-CM | POA: Diagnosis not present

## 2021-06-28 DIAGNOSIS — R350 Frequency of micturition: Secondary | ICD-10-CM | POA: Diagnosis not present

## 2021-06-28 DIAGNOSIS — N390 Urinary tract infection, site not specified: Secondary | ICD-10-CM | POA: Diagnosis not present

## 2021-07-19 DIAGNOSIS — E1169 Type 2 diabetes mellitus with other specified complication: Secondary | ICD-10-CM | POA: Diagnosis not present

## 2021-07-26 DIAGNOSIS — U099 Post covid-19 condition, unspecified: Secondary | ICD-10-CM | POA: Diagnosis not present

## 2021-07-26 DIAGNOSIS — N400 Enlarged prostate without lower urinary tract symptoms: Secondary | ICD-10-CM | POA: Diagnosis not present

## 2021-07-26 DIAGNOSIS — I129 Hypertensive chronic kidney disease with stage 1 through stage 4 chronic kidney disease, or unspecified chronic kidney disease: Secondary | ICD-10-CM | POA: Diagnosis not present

## 2021-07-26 DIAGNOSIS — R350 Frequency of micturition: Secondary | ICD-10-CM | POA: Diagnosis not present

## 2021-07-26 DIAGNOSIS — R410 Disorientation, unspecified: Secondary | ICD-10-CM | POA: Diagnosis not present

## 2021-07-26 DIAGNOSIS — M542 Cervicalgia: Secondary | ICD-10-CM | POA: Diagnosis not present

## 2021-07-26 DIAGNOSIS — E1169 Type 2 diabetes mellitus with other specified complication: Secondary | ICD-10-CM | POA: Diagnosis not present

## 2021-07-26 DIAGNOSIS — E782 Mixed hyperlipidemia: Secondary | ICD-10-CM | POA: Diagnosis not present

## 2021-07-26 DIAGNOSIS — R634 Abnormal weight loss: Secondary | ICD-10-CM | POA: Diagnosis not present

## 2021-08-23 DIAGNOSIS — E1169 Type 2 diabetes mellitus with other specified complication: Secondary | ICD-10-CM | POA: Diagnosis not present

## 2021-08-23 DIAGNOSIS — R350 Frequency of micturition: Secondary | ICD-10-CM | POA: Diagnosis not present

## 2021-08-23 DIAGNOSIS — N400 Enlarged prostate without lower urinary tract symptoms: Secondary | ICD-10-CM | POA: Diagnosis not present

## 2021-10-26 DIAGNOSIS — L299 Pruritus, unspecified: Secondary | ICD-10-CM | POA: Diagnosis not present

## 2021-10-26 DIAGNOSIS — N400 Enlarged prostate without lower urinary tract symptoms: Secondary | ICD-10-CM | POA: Diagnosis not present

## 2021-10-26 DIAGNOSIS — R350 Frequency of micturition: Secondary | ICD-10-CM | POA: Diagnosis not present

## 2021-11-17 DIAGNOSIS — Z125 Encounter for screening for malignant neoplasm of prostate: Secondary | ICD-10-CM | POA: Diagnosis not present

## 2021-11-17 DIAGNOSIS — E782 Mixed hyperlipidemia: Secondary | ICD-10-CM | POA: Diagnosis not present

## 2021-11-17 DIAGNOSIS — E1169 Type 2 diabetes mellitus with other specified complication: Secondary | ICD-10-CM | POA: Diagnosis not present

## 2021-11-22 DIAGNOSIS — E782 Mixed hyperlipidemia: Secondary | ICD-10-CM | POA: Diagnosis not present

## 2021-11-22 DIAGNOSIS — R634 Abnormal weight loss: Secondary | ICD-10-CM | POA: Diagnosis not present

## 2021-11-22 DIAGNOSIS — U099 Post covid-19 condition, unspecified: Secondary | ICD-10-CM | POA: Diagnosis not present

## 2021-11-22 DIAGNOSIS — I129 Hypertensive chronic kidney disease with stage 1 through stage 4 chronic kidney disease, or unspecified chronic kidney disease: Secondary | ICD-10-CM | POA: Diagnosis not present

## 2021-11-22 DIAGNOSIS — N400 Enlarged prostate without lower urinary tract symptoms: Secondary | ICD-10-CM | POA: Diagnosis not present

## 2021-11-22 DIAGNOSIS — M542 Cervicalgia: Secondary | ICD-10-CM | POA: Diagnosis not present

## 2021-11-22 DIAGNOSIS — K219 Gastro-esophageal reflux disease without esophagitis: Secondary | ICD-10-CM | POA: Diagnosis not present

## 2021-11-22 DIAGNOSIS — E1169 Type 2 diabetes mellitus with other specified complication: Secondary | ICD-10-CM | POA: Diagnosis not present

## 2021-11-22 DIAGNOSIS — F411 Generalized anxiety disorder: Secondary | ICD-10-CM | POA: Diagnosis not present

## 2022-02-07 ENCOUNTER — Emergency Department (HOSPITAL_COMMUNITY)
Admission: EM | Admit: 2022-02-07 | Discharge: 2022-02-07 | Disposition: A | Discharge status: Home / Self Care | Payer: Medicare PPO | Attending: Student | Admitting: Student

## 2022-02-07 ENCOUNTER — Other Ambulatory Visit: Payer: Self-pay

## 2022-02-07 ENCOUNTER — Emergency Department (HOSPITAL_COMMUNITY): Payer: Medicare PPO

## 2022-02-07 ENCOUNTER — Encounter (HOSPITAL_COMMUNITY): Payer: Self-pay

## 2022-02-07 DIAGNOSIS — I10 Essential (primary) hypertension: Secondary | ICD-10-CM | POA: Diagnosis not present

## 2022-02-07 DIAGNOSIS — Z87891 Personal history of nicotine dependence: Secondary | ICD-10-CM | POA: Diagnosis not present

## 2022-02-07 DIAGNOSIS — R4182 Altered mental status, unspecified: Secondary | ICD-10-CM | POA: Insufficient documentation

## 2022-02-07 DIAGNOSIS — E876 Hypokalemia: Secondary | ICD-10-CM | POA: Diagnosis not present

## 2022-02-07 DIAGNOSIS — R410 Disorientation, unspecified: Secondary | ICD-10-CM | POA: Diagnosis not present

## 2022-02-07 DIAGNOSIS — R404 Transient alteration of awareness: Secondary | ICD-10-CM | POA: Insufficient documentation

## 2022-02-07 LAB — COMPREHENSIVE METABOLIC PANEL
ALT: 20 U/L (ref 0–44)
AST: 23 U/L (ref 15–41)
Albumin: 3.6 g/dL (ref 3.5–5.0)
Alkaline Phosphatase: 50 U/L (ref 38–126)
Anion gap: 8 (ref 5–15)
BUN: 15 mg/dL (ref 8–23)
CO2: 24 mmol/L (ref 22–32)
Calcium: 8.7 mg/dL — ABNORMAL LOW (ref 8.9–10.3)
Chloride: 110 mmol/L (ref 98–111)
Creatinine, Ser: 1.04 mg/dL (ref 0.61–1.24)
GFR, Estimated: >60 mL/min (ref >60)
Glucose, Bld: 151 mg/dL — ABNORMAL HIGH (ref 70–99)
Potassium: 3.4 mmol/L — ABNORMAL LOW (ref 3.5–5.1)
Sodium: 142 mmol/L (ref 135–145)
Total Bilirubin: 1 mg/dL (ref 0.3–1.2)
Total Protein: 6 g/dL — ABNORMAL LOW (ref 6.5–8.1)

## 2022-02-07 LAB — RAPID URINE DRUG SCREEN, HOSP PERFORMED
Amphetamines: NOT DETECTED
Barbiturates: NOT DETECTED
Benzodiazepines: POSITIVE — AB
Cocaine: NOT DETECTED
Opiates: NOT DETECTED
Tetrahydrocannabinol: NOT DETECTED

## 2022-02-07 LAB — URINALYSIS, ROUTINE W REFLEX MICROSCOPIC
Bilirubin Urine: NEGATIVE
Glucose, UA: NEGATIVE mg/dL
Hgb urine dipstick: NEGATIVE
Ketones, ur: NEGATIVE mg/dL
Leukocytes,Ua: NEGATIVE
Nitrite: NEGATIVE
Protein, ur: NEGATIVE mg/dL
Specific Gravity, Urine: 1.018 (ref 1.005–1.030)
pH: 7 (ref 5.0–8.0)

## 2022-02-07 LAB — ETHANOL: Alcohol, Ethyl (B): <10 mg/dL (ref <10)

## 2022-02-07 LAB — CBC
HCT: 37.3 % — ABNORMAL LOW (ref 39.0–52.0)
Hemoglobin: 13.7 g/dL (ref 13.0–17.0)
MCH: 33.4 pg (ref 26.0–34.0)
MCHC: 36.7 g/dL — ABNORMAL HIGH (ref 30.0–36.0)
MCV: 91 fL (ref 80.0–100.0)
Platelets: 138 10*3/uL — ABNORMAL LOW (ref 150–400)
RBC: 4.1 MIL/uL — ABNORMAL LOW (ref 4.22–5.81)
RDW: 13 % (ref 11.5–15.5)
WBC: 5.4 10*3/uL (ref 4.0–10.5)
nRBC: 0 % (ref 0.0–0.2)

## 2022-02-07 LAB — CBG MONITORING, ED: Glucose-Capillary: 168 mg/dL — ABNORMAL HIGH (ref 70–99)

## 2022-02-07 LAB — CK: Total CK: 77 U/L (ref 49–397)

## 2022-02-07 NOTE — ED Notes (Signed)
Neighbor in room. He stated that pt was mowing earlier and then when he went to pt's house the patient had turned his air off and was clammy. Said that patient is back to acting like his normal self.

## 2022-02-07 NOTE — ED Triage Notes (Signed)
Rcems from home. Neighbor saw him at 1pm he was normal. At 6pm he was confused and not acting himself and only in his underwear. Told ems he took his meds as supposed to. During transport he was acting appropriate. Wife is out of town. Foul smelling urine.  20g L wrist. Vss with ems.

## 2022-02-08 NOTE — ED Provider Notes (Signed)
[facility] EMERGENCY DEPARTMENT Provider Note  CSN: [id number] Arrival date & time: 02/07/22 1849  Chief Complaint(s) Altered Mental Status  HPI Corey Frederick is a 87 y.o. male with PMH anxiety, depression, HTN who presents emergency department for evaluation of a transient episode of altered awareness.  History obtained from the patient, his wife and his neighbor.  Patient states that he feels completely fine and does not know why he is here in the emergency department today.  Patient's neighbor states that he spoke with the patient's wife and earlier today he was talking on the phone with her and she was unable to understand what he was saying.  The neighbor then went to check on the patient who found the patient to not have his air conditioning on and was in his underwear in the kitchen acting abnormally.  Patient then brought to the emergency department for further evaluation.  Here in the ER, patient is alert and oriented answering all questions appropriately with no complaints of chest pain, shortness of breath, Donnell pain, nausea, vomiting or other systemic symptoms.  Of note, patient does take lorazepam nightly but is unsure if he may have taken this today.   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 Authorizing Provider  cetirizine (ZYRTEC) 10 MG tablet Take 10 mg by mouth  daily.   Yes [provider]  FLUoxetine (PROZAC) 10 MG capsule Take 10 mg by mouth daily. 09/15/19  Yes [provider]  LORazepam (ATIVAN) 1 MG tablet Take 1 mg by mouth at bedtime.   Yes [provider]  Metformin HCl 500 MG/5ML SOLN Take 5 mLs by mouth 2 (two) times daily. 01/02/22  Yes [provider]  pantoprazole (PROTONIX) 40 MG tablet Take 40 mg by mouth daily.   Yes [provider]  propranolol (INDERAL) 10 MG tablet Take 10 mg by mouth 2 (two) times daily. 12/10/21  Yes [provider]  ramipril (ALTACE) 5 MG capsule Take 5 mg by mouth daily.   Yes [provider]  rosuvastatin (CRESTOR) 10 MG tablet Take 10 mg by mouth daily. 11/17/19  Yes [provider]  triamcinolone ointment (KENALOG) 0.1 % Apply 1 Application topically 2 (two) times daily. 01/24/22  Yes [provider]  Vitamin D, Cholecalciferol, 1000 UNITS CAPS Take 1,000 Units by mouth daily.   Yes [provider]  HYDROcodone-acetaminophen (NORCO) 10-325 MG tablet Take 1 tablet by mouth every 6 (six) hours as needed. Patient not taking: Reported on 08/05/2020 07/28/20   Lanae Crumbly, PA-C  Past Surgical History Past Surgical History:  Procedure Laterality Date   ANTERIOR CERVICAL DECOMP/DISCECTOMY FUSION N/A 07/28/2020   Procedure: [name] ANTERIOR CERVICAL DECOMPRESSION/DISCECTOMY FUSION, ALLOGRAFT, PLATE;  Surgeon: Eldred Manges, MD;  Location: [facility] OR;  Service: Orthopedics;  Laterality: N/A;   COLONOSCOPY     5 years ago - Dr. Darrick Penna   ESOPHAGOGASTRODUODENOSCOPY N/A 06/18/2015   Procedure: ESOPHAGOGASTRODUODENOSCOPY (EGD);  Surgeon: Malissa Hippo, MD;  Location: [facility] ENDO SUITE;  Service: Endoscopy;  Laterality: N/A;  155 - moved to 2:40 - [name] notified pt   Family History Family History  Problem  Relation Age of Onset   Anxiety disorder Mother    Heart disease Father    Emphysema Sister    Diabetes Brother     Social History Social History   Tobacco Use   Smoking status: Former   Smokeless tobacco: Never  Building services engineer Use: Never used  Substance Use Topics   Alcohol use: Yes    Alcohol/week: 0.0 standard drinks of alcohol    Comment: Social   Drug use: Never   Allergies Patient has no known allergies.  Review of Systems Review of Systems  Psychiatric/Behavioral:  Positive for confusion.     Physical Exam Vital Signs  I have reviewed the triage vital signs BP (!) 166/95   Pulse 61   Temp 98 F (36.7 C) (Oral)   Resp 19   Ht 5\' 10"  (1.778 m)   Wt 60.3 kg   SpO2 100%   BMI 19.07 kg/m   Physical Exam Vitals and nursing note reviewed.  Constitutional:      General: He is not in acute distress.    Appearance: He is well-developed.  HENT:     Head: Normocephalic and atraumatic.  Eyes:     Conjunctiva/sclera: Conjunctivae normal.  Cardiovascular:     Rate and Rhythm: Normal rate and regular rhythm.     Heart sounds: No murmur heard. Pulmonary:     Effort: Pulmonary effort is normal. No respiratory distress.     Breath sounds: Normal breath sounds.  Abdominal:     Palpations: Abdomen is soft.     Tenderness: There is no abdominal tenderness.  Musculoskeletal:        General: No swelling.     Cervical back: Neck supple.  Skin:    General: Skin is warm and dry.     Capillary Refill: Capillary refill takes less than 2 seconds.  Neurological:     Mental Status: He is alert and oriented to person, place, and time.     Cranial Nerves: No cranial nerve deficit.     Sensory: No sensory deficit.     Motor: No weakness.  Psychiatric:        Mood and Affect: Mood normal.     ED Results and Treatments Labs (all labs ordered are listed, but only abnormal results are displayed) Labs Reviewed  CBC - Abnormal; Notable for the following components:       Result Value   RBC 4.10 (*)    HCT 37.3 (*)    MCHC 36.7 (*)    Platelets 138 (*)    All other components within normal limits  RAPID URINE DRUG SCREEN, HOSP PERFORMED - Abnormal; Notable for the following components:   Benzodiazepines POSITIVE (*)    All other components within normal limits  COMPREHENSIVE METABOLIC PANEL - Abnormal; Notable for the following components:   Potassium 3.4 (*)    Glucose, Bld  151 (*)    Calcium 8.7 (*)    Total Protein 6.0 (*)    All other components within normal limits  CBG MONITORING, ED - Abnormal; Notable for the following components:   Glucose-Capillary 168 (*)    All other components within normal limits  URINALYSIS, ROUTINE W REFLEX MICROSCOPIC  CK  ETHANOL                                                                                                                          Radiology CT Head Wo Contrast  Result Date: 02/07/2022 CLINICAL DATA:  Delirium EXAM: CT HEAD WITHOUT CONTRAST TECHNIQUE: Contiguous axial images were obtained from the base of the skull through the vertex without intravenous contrast. RADIATION DOSE REDUCTION: This exam was performed according to the departmental dose-optimization program which includes automated exposure control, adjustment of the mA and/or kV according to patient size and/or use of iterative reconstruction technique. COMPARISON:  05/17/2021 FINDINGS: Brain: No evidence of acute infarction, hemorrhage, hydrocephalus, extra-axial collection or mass lesion/mass effect. Mild cortical and central atrophy. Secondary ventricular prominence. Mild subcortical white matter and periventricular small vessel ischemic changes. Vascular: No hyperdense vessel or unexpected calcification. Skull: Normal. Negative for fracture or focal lesion. Sinuses/Orbits: The visualized paranasal sinuses are essentially clear. The mastoid air cells are unopacified. Other: None. IMPRESSION: No acute intracranial abnormality. Atrophy  with small vessel ischemic changes. Electronically Signed   By: Charline Bills M.D.   On: 02/07/2022 21:30    Pertinent labs & imaging results that were available during my care of the patient were reviewed by me and considered in my medical decision making (see MDM for details).  Medications Ordered in ED Medications - No data to display                                                                                                                                   Procedures Procedures  (including critical care time)  Medical Decision Making / ED Course   This patient presents to the ED for concern of AMS, this involves an extensive number of treatment options, and is a complaint that carries with it a high risk of complications and morbidity.  The differential diagnosis includes delirium, polypharmacy, UTI, transient global amnesia, electrolyte abnormality  MDM: Patient seen emergency room for evaluation of transient episode of altered awareness.  Physical exam is unremarkable including no focal  motor or sensory deficits.  Laboratory evaluation with mild hypokalemia to 3.4, mild hypocalcemia 8.7 but is otherwise unremarkable.  Alcohol negative glucose normal..  UDS positive for benzodiazepines which correlates with the patient's lorazepam use.  CT head unremarkable.  Suspect patient may have had a transient episode of altered awareness secondary to lorazepam use during the day, but other etiologies may be an episode of transient global amnesia and he will require outpatient PCP follow-up.  At this time, patient is requesting to be discharged and this is not unreasonable given his normal work-up.  I had a long conversation with both the patient and his wife and they were instructed that he needs to return to the emergency department if his symptoms return for further work-up and perhaps MRI imaging.  Patient then discharged with return precautions.   Additional history  obtained: -Additional history obtained from wife and neighbor -External records from outside source obtained and reviewed including: Chart review including previous notes, labs, imaging, consultation notes   Lab Tests: -I ordered, reviewed, and interpreted labs.   The pertinent results include:   Labs Reviewed  CBC - Abnormal; Notable for the following components:      Result Value   RBC 4.10 (*)    HCT 37.3 (*)    MCHC 36.7 (*)    Platelets 138 (*)    All other components within normal limits  RAPID URINE DRUG SCREEN, HOSP PERFORMED - Abnormal; Notable for the following components:   Benzodiazepines POSITIVE (*)    All other components within normal limits  COMPREHENSIVE METABOLIC PANEL - Abnormal; Notable for the following components:   Potassium 3.4 (*)    Glucose, Bld 151 (*)    Calcium 8.7 (*)    Total Protein 6.0 (*)    All other components within normal limits  CBG MONITORING, ED - Abnormal; Notable for the following components:   Glucose-Capillary 168 (*)    All other components within normal limits  URINALYSIS, ROUTINE W REFLEX MICROSCOPIC  CK  ETHANOL      EKG   EKG Interpretation  Date/Time:    Ventricular Rate:    PR Interval:    QRS Duration:   QT Interval:    QTC Calculation:   R Axis:     Text Interpretation:           Imaging Studies ordered: I ordered imaging studies including CT head I independently visualized and interpreted imaging. I agree with the radiologist interpretation   Medicines ordered and prescription drug management: No orders of the defined types were placed in this encounter.   -I have reviewed the patients home medicines and have made adjustments as needed  Critical interventions one    Cardiac Monitoring: The patient was maintained on a cardiac monitor.  I personally viewed and interpreted the cardiac monitored which showed an underlying rhythm of: NSR  Social Determinants of Health:  Factors impacting patients  care include: none   Reevaluation: After the interventions noted above, I reevaluated the patient and found that they have :improved  Co morbidities that complicate the patient evaluation  Past Medical History:  Diagnosis Date   Anxiety    Depression    GERD (gastroesophageal reflux disease)    Hypertension    Pre-diabetes       Dispostion: I considered admission for this patient, but he currently does not meet inpatient criteria for admission is safe for discharge with outpatient follow-up and return precautions     Final Clinical Impression(s) /  ED Diagnoses Final diagnoses:  Transient alteration of awareness     @PCDICTATION @    Shanedra Lave, Debe Coder, MD 02/08/22 [phone]

## 2022-02-09 ENCOUNTER — Other Ambulatory Visit (HOSPITAL_COMMUNITY): Payer: Self-pay | Admitting: Family Medicine

## 2022-02-09 ENCOUNTER — Other Ambulatory Visit: Payer: Self-pay | Admitting: Family Medicine

## 2022-02-09 DIAGNOSIS — R300 Dysuria: Secondary | ICD-10-CM | POA: Diagnosis not present

## 2022-02-09 DIAGNOSIS — R4182 Altered mental status, unspecified: Secondary | ICD-10-CM | POA: Diagnosis not present

## 2022-02-09 DIAGNOSIS — E876 Hypokalemia: Secondary | ICD-10-CM | POA: Diagnosis not present

## 2022-02-09 DIAGNOSIS — R413 Other amnesia: Secondary | ICD-10-CM | POA: Diagnosis not present

## 2022-02-16 DIAGNOSIS — E1169 Type 2 diabetes mellitus with other specified complication: Secondary | ICD-10-CM | POA: Diagnosis not present

## 2022-02-16 DIAGNOSIS — I129 Hypertensive chronic kidney disease with stage 1 through stage 4 chronic kidney disease, or unspecified chronic kidney disease: Secondary | ICD-10-CM | POA: Diagnosis not present

## 2022-02-21 DIAGNOSIS — E876 Hypokalemia: Secondary | ICD-10-CM | POA: Diagnosis not present

## 2022-02-21 DIAGNOSIS — R4182 Altered mental status, unspecified: Secondary | ICD-10-CM | POA: Diagnosis not present

## 2022-02-21 DIAGNOSIS — N400 Enlarged prostate without lower urinary tract symptoms: Secondary | ICD-10-CM | POA: Diagnosis not present

## 2022-02-21 DIAGNOSIS — I129 Hypertensive chronic kidney disease with stage 1 through stage 4 chronic kidney disease, or unspecified chronic kidney disease: Secondary | ICD-10-CM | POA: Diagnosis not present

## 2022-02-21 DIAGNOSIS — R413 Other amnesia: Secondary | ICD-10-CM | POA: Diagnosis not present

## 2022-02-21 DIAGNOSIS — E1169 Type 2 diabetes mellitus with other specified complication: Secondary | ICD-10-CM | POA: Diagnosis not present

## 2022-02-21 DIAGNOSIS — U099 Post covid-19 condition, unspecified: Secondary | ICD-10-CM | POA: Diagnosis not present

## 2022-02-21 DIAGNOSIS — M542 Cervicalgia: Secondary | ICD-10-CM | POA: Diagnosis not present

## 2022-02-21 DIAGNOSIS — R634 Abnormal weight loss: Secondary | ICD-10-CM | POA: Diagnosis not present

## 2022-02-23 ENCOUNTER — Ambulatory Visit (HOSPITAL_COMMUNITY)
Admission: RE | Admit: 2022-02-23 | Discharge: 2022-02-23 | Disposition: A | Discharge status: Home / Self Care | Payer: Medicare PPO | Source: Ambulatory Visit | Attending: Family Medicine | Admitting: Family Medicine

## 2022-02-23 DIAGNOSIS — R4182 Altered mental status, unspecified: Secondary | ICD-10-CM | POA: Insufficient documentation

## 2022-02-23 DIAGNOSIS — R413 Other amnesia: Secondary | ICD-10-CM | POA: Diagnosis not present

## 2022-02-23 DIAGNOSIS — G319 Degenerative disease of nervous system, unspecified: Secondary | ICD-10-CM | POA: Diagnosis not present

## 2022-03-16 DIAGNOSIS — R413 Other amnesia: Secondary | ICD-10-CM | POA: Diagnosis not present

## 2022-03-16 DIAGNOSIS — N3281 Overactive bladder: Secondary | ICD-10-CM | POA: Diagnosis not present

## 2022-06-02 DIAGNOSIS — E1169 Type 2 diabetes mellitus with other specified complication: Secondary | ICD-10-CM | POA: Diagnosis not present

## 2022-06-02 DIAGNOSIS — I129 Hypertensive chronic kidney disease with stage 1 through stage 4 chronic kidney disease, or unspecified chronic kidney disease: Secondary | ICD-10-CM | POA: Diagnosis not present

## 2022-06-02 DIAGNOSIS — E876 Hypokalemia: Secondary | ICD-10-CM | POA: Diagnosis not present

## 2022-06-08 DIAGNOSIS — E1165 Type 2 diabetes mellitus with hyperglycemia: Secondary | ICD-10-CM | POA: Diagnosis not present

## 2022-06-08 DIAGNOSIS — U099 Post covid-19 condition, unspecified: Secondary | ICD-10-CM | POA: Diagnosis not present

## 2022-06-08 DIAGNOSIS — E876 Hypokalemia: Secondary | ICD-10-CM | POA: Diagnosis not present

## 2022-06-08 DIAGNOSIS — N400 Enlarged prostate without lower urinary tract symptoms: Secondary | ICD-10-CM | POA: Diagnosis not present

## 2022-06-08 DIAGNOSIS — E782 Mixed hyperlipidemia: Secondary | ICD-10-CM | POA: Diagnosis not present

## 2022-06-08 DIAGNOSIS — I129 Hypertensive chronic kidney disease with stage 1 through stage 4 chronic kidney disease, or unspecified chronic kidney disease: Secondary | ICD-10-CM | POA: Diagnosis not present

## 2022-06-08 DIAGNOSIS — R634 Abnormal weight loss: Secondary | ICD-10-CM | POA: Diagnosis not present

## 2022-06-08 DIAGNOSIS — R413 Other amnesia: Secondary | ICD-10-CM | POA: Diagnosis not present

## 2022-06-08 DIAGNOSIS — M542 Cervicalgia: Secondary | ICD-10-CM | POA: Diagnosis not present

## 2022-07-05 DIAGNOSIS — R059 Cough, unspecified: Secondary | ICD-10-CM | POA: Diagnosis not present

## 2022-07-06 ENCOUNTER — Other Ambulatory Visit: Payer: Self-pay

## 2022-07-06 ENCOUNTER — Encounter (HOSPITAL_COMMUNITY): Payer: Self-pay | Admitting: *Deleted

## 2022-07-06 ENCOUNTER — Emergency Department (HOSPITAL_COMMUNITY)
Admission: EM | Admit: 2022-07-06 | Discharge: 2022-07-06 | Disposition: A | Discharge status: Home / Self Care | Payer: Medicare PPO | Attending: Emergency Medicine | Admitting: Emergency Medicine

## 2022-07-06 DIAGNOSIS — T50901A Poisoning by unspecified drugs, medicaments and biological substances, accidental (unintentional), initial encounter: Secondary | ICD-10-CM

## 2022-07-06 DIAGNOSIS — T485X1A Poisoning by other anti-common-cold drugs, accidental (unintentional), initial encounter: Secondary | ICD-10-CM | POA: Insufficient documentation

## 2022-07-06 DIAGNOSIS — R059 Cough, unspecified: Secondary | ICD-10-CM | POA: Diagnosis not present

## 2022-07-06 DIAGNOSIS — R001 Bradycardia, unspecified: Secondary | ICD-10-CM | POA: Diagnosis not present

## 2022-07-06 DIAGNOSIS — I10 Essential (primary) hypertension: Secondary | ICD-10-CM | POA: Diagnosis not present

## 2022-07-06 DIAGNOSIS — W19XXXA Unspecified fall, initial encounter: Secondary | ICD-10-CM | POA: Diagnosis not present

## 2022-07-06 NOTE — ED Provider Notes (Signed)
[facility] EMERGENCY DEPARTMENT Provider Note   CSN: [id number] Arrival date & time: 07/06/22  1209     History  Chief Complaint  Patient presents with   Drug Overdose    Corey Frederick is a 87 y.o. male presented to the with accidental overdose of Delsym, drank 80 ml (entire small bottle) with daily maximum 20 ml.  This occurred around 9 AM this morning according to the patient's friend is present at the bedside.  The friend says the patient is a little himself and is some early developing dementia, but has been able to keep himself organized and takes a pill organizer for most of his medications.  The patient was on the phone with his wife who is in an assisted living facility, and reported that he had drank the entire bottle of Delsym.  This was accidental and there is no SI reported.  EMS was called to bring the patient into the ED.  His friend is not present at the bedside.  HPI     Home Medications Prior to Admission medications   Medication Sig Start Date End Date Taking? Authorizing Provider  cetirizine (ZYRTEC) 10 MG tablet Take 10 mg by mouth daily.    [provider]  FLUoxetine (PROZAC) 10 MG capsule Take 10 mg by mouth daily. 09/15/19   [provider]  HYDROcodone-acetaminophen (NORCO) 10-325 MG tablet Take 1 tablet by mouth every 6 (six) hours as needed. Patient not taking: Reported on 08/05/2020 07/28/20   Lanae Crumbly, PA-C  LORazepam (ATIVAN) 1 MG tablet Take 1 mg by mouth at bedtime.    [provider]  Metformin HCl 500 MG/5ML SOLN Take 5 mLs by mouth 2 (two) times daily. 01/02/22   [provider]  pantoprazole (PROTONIX) 40 MG tablet Take 40 mg by mouth daily.    [provider]  propranolol (INDERAL) 10 MG tablet Take 10 mg by mouth 2 (two) times daily. 12/10/21   [provider]  ramipril (ALTACE) 5 MG capsule Take 5 mg by mouth daily.    [provider]  rosuvastatin (CRESTOR) 10 MG tablet Take 10  mg by mouth daily. 11/17/19   [provider]  triamcinolone ointment (KENALOG) 0.1 % Apply 1 Application topically 2 (two) times daily. 01/24/22   [provider]  Vitamin D, Cholecalciferol, 1000 UNITS CAPS Take 1,000 Units by mouth daily.    [provider]      Allergies    Patient has no known allergies.    Review of Systems   Review of Systems  Physical Exam Updated Vital Signs BP 124/87   Pulse 64   Temp 98.2 F (36.8 C) (Oral)   Resp 12   SpO2 100%  Physical Exam Constitutional:      General: He is not in acute distress. HENT:     Head: Normocephalic and atraumatic.  Eyes:     Conjunctiva/sclera: Conjunctivae normal.     Pupils: Pupils are equal, round, and reactive to light.  Cardiovascular:     Rate and Rhythm: Normal rate and regular rhythm.  Pulmonary:     Effort: Pulmonary effort is normal. No respiratory distress.  Abdominal:     General: There is no distension.     Tenderness: There is no abdominal tenderness.  Skin:    General: Skin is warm and dry.  Neurological:     General: No focal deficit present.     Mental Status: He is alert. Mental status is  at baseline.  Psychiatric:        Mood and Affect: Mood normal.     ED Results / Procedures / Treatments   Labs (all labs ordered are listed, but only abnormal results are displayed) Labs Reviewed - No data to display  EKG EKG Interpretation  Date/Time:  Thursday July 06 2022 12:30:00 EST Ventricular Rate:  75 PR Interval:  55 QRS Duration: 92 QT Interval:  426 QTC Calculation: 476 R Axis:   28 Text Interpretation: Sinus rhythm Short PR interval Low voltage, extremity and precordial leads Borderline prolonged QT interval Confirmed by Alvester Chou [phone]) on 07/06/2022 1:06:53 PM  Radiology No results found.  Procedures Procedures    Medications Ordered in ED Medications - No data to display  ED Course/ Medical Decision Making/ A&P Clinical Course as of  07/06/22 1653  Thu Jul 06, 2022  1506 Reassessed and stable and medically cleared [MT]    Clinical Course User Index [MT] Kriya Westra, Kermit Balo, MD                           Medical Decision Making  Patient is here with accidental ingestion of delsym, cough syrup.  This occurred 4 hours prior to arrival.  Will continue to monitor him till 3 PM which is a 6-hour observation mark.  He is otherwise at baseline mental state according to his friend at bedside.  EKG per my interpretation shows sinus rhythm no acute ischemic findings or no evident arrhythmias.  I do not see an indication for further blood work at this time unless he were to develop sudden or worsening hallucinations and behavioral changes.        Final Clinical Impression(s) / ED Diagnoses Final diagnoses:  Accidental overdose, initial encounter    Rx / DC Orders ED Discharge Orders     None         Meela Wareing, Kermit Balo, MD 07/06/22 [phone]

## 2022-07-06 NOTE — ED Triage Notes (Addendum)
Reported that pt took almost entire bottle of Delsym cough med-bottle is 21mL. Pt alert and oriented to person, place and age.  Reported pt with hx of dementia.  Lives with wife but wife was not at home.

## 2022-07-06 NOTE — ED Notes (Signed)
ED Provider at bedside. 

## 2022-07-06 NOTE — ED Notes (Addendum)
Poison control called and spoke with Kathlene November- recommend an EKG, monitor for 4 hours due to age, assess for AMS, hallucinations and seizures.

## 2022-07-06 NOTE — Discharge Instructions (Addendum)
Please discuss any concerns about dementia or memory issues with the primary care doctor.

## 2022-07-12 DIAGNOSIS — R413 Other amnesia: Secondary | ICD-10-CM | POA: Diagnosis not present

## 2022-07-12 DIAGNOSIS — Z681 Body mass index (BMI) 19 or less, adult: Secondary | ICD-10-CM | POA: Diagnosis not present

## 2022-07-12 DIAGNOSIS — T50901D Poisoning by unspecified drugs, medicaments and biological substances, accidental (unintentional), subsequent encounter: Secondary | ICD-10-CM | POA: Diagnosis not present

## 2022-08-01 DIAGNOSIS — R413 Other amnesia: Secondary | ICD-10-CM | POA: Diagnosis not present

## 2022-08-16 DIAGNOSIS — R2689 Other abnormalities of gait and mobility: Secondary | ICD-10-CM | POA: Diagnosis not present

## 2022-08-18 DIAGNOSIS — R2689 Other abnormalities of gait and mobility: Secondary | ICD-10-CM | POA: Diagnosis not present

## 2022-08-22 DIAGNOSIS — M6281 Muscle weakness (generalized): Secondary | ICD-10-CM | POA: Diagnosis not present

## 2022-08-22 DIAGNOSIS — R2689 Other abnormalities of gait and mobility: Secondary | ICD-10-CM | POA: Diagnosis not present

## 2022-08-23 DIAGNOSIS — M6281 Muscle weakness (generalized): Secondary | ICD-10-CM | POA: Diagnosis not present

## 2022-08-24 DIAGNOSIS — R2689 Other abnormalities of gait and mobility: Secondary | ICD-10-CM | POA: Diagnosis not present

## 2022-08-28 DIAGNOSIS — Z0100 Encounter for examination of eyes and vision without abnormal findings: Secondary | ICD-10-CM | POA: Diagnosis not present

## 2022-08-28 DIAGNOSIS — H524 Presbyopia: Secondary | ICD-10-CM | POA: Diagnosis not present

## 2022-08-29 DIAGNOSIS — R2689 Other abnormalities of gait and mobility: Secondary | ICD-10-CM | POA: Diagnosis not present

## 2022-08-30 DIAGNOSIS — M6281 Muscle weakness (generalized): Secondary | ICD-10-CM | POA: Diagnosis not present

## 2022-08-31 DIAGNOSIS — R2689 Other abnormalities of gait and mobility: Secondary | ICD-10-CM | POA: Diagnosis not present

## 2022-09-01 DIAGNOSIS — M6281 Muscle weakness (generalized): Secondary | ICD-10-CM | POA: Diagnosis not present

## 2022-09-05 DIAGNOSIS — R2689 Other abnormalities of gait and mobility: Secondary | ICD-10-CM | POA: Diagnosis not present

## 2022-09-06 DIAGNOSIS — M6281 Muscle weakness (generalized): Secondary | ICD-10-CM | POA: Diagnosis not present

## 2022-09-07 DIAGNOSIS — R2689 Other abnormalities of gait and mobility: Secondary | ICD-10-CM | POA: Diagnosis not present

## 2022-09-08 DIAGNOSIS — M6281 Muscle weakness (generalized): Secondary | ICD-10-CM | POA: Diagnosis not present

## 2022-09-12 DIAGNOSIS — R2689 Other abnormalities of gait and mobility: Secondary | ICD-10-CM | POA: Diagnosis not present

## 2022-09-13 DIAGNOSIS — E876 Hypokalemia: Secondary | ICD-10-CM | POA: Diagnosis not present

## 2022-09-13 DIAGNOSIS — I129 Hypertensive chronic kidney disease with stage 1 through stage 4 chronic kidney disease, or unspecified chronic kidney disease: Secondary | ICD-10-CM | POA: Diagnosis not present

## 2022-09-13 DIAGNOSIS — E1165 Type 2 diabetes mellitus with hyperglycemia: Secondary | ICD-10-CM | POA: Diagnosis not present

## 2022-09-14 DIAGNOSIS — R2689 Other abnormalities of gait and mobility: Secondary | ICD-10-CM | POA: Diagnosis not present

## 2022-09-15 DIAGNOSIS — M6281 Muscle weakness (generalized): Secondary | ICD-10-CM | POA: Diagnosis not present

## 2022-09-19 DIAGNOSIS — E876 Hypokalemia: Secondary | ICD-10-CM | POA: Diagnosis not present

## 2022-09-19 DIAGNOSIS — R2689 Other abnormalities of gait and mobility: Secondary | ICD-10-CM | POA: Diagnosis not present

## 2022-09-19 DIAGNOSIS — Z0001 Encounter for general adult medical examination with abnormal findings: Secondary | ICD-10-CM | POA: Diagnosis not present

## 2022-09-19 DIAGNOSIS — R634 Abnormal weight loss: Secondary | ICD-10-CM | POA: Diagnosis not present

## 2022-09-19 DIAGNOSIS — N400 Enlarged prostate without lower urinary tract symptoms: Secondary | ICD-10-CM | POA: Diagnosis not present

## 2022-09-19 DIAGNOSIS — U099 Post covid-19 condition, unspecified: Secondary | ICD-10-CM | POA: Diagnosis not present

## 2022-09-19 DIAGNOSIS — E1165 Type 2 diabetes mellitus with hyperglycemia: Secondary | ICD-10-CM | POA: Diagnosis not present

## 2022-09-19 DIAGNOSIS — M542 Cervicalgia: Secondary | ICD-10-CM | POA: Diagnosis not present

## 2022-09-19 DIAGNOSIS — E782 Mixed hyperlipidemia: Secondary | ICD-10-CM | POA: Diagnosis not present

## 2022-09-19 DIAGNOSIS — R413 Other amnesia: Secondary | ICD-10-CM | POA: Diagnosis not present

## 2022-09-21 DIAGNOSIS — R2689 Other abnormalities of gait and mobility: Secondary | ICD-10-CM | POA: Diagnosis not present

## 2022-09-25 DIAGNOSIS — R2689 Other abnormalities of gait and mobility: Secondary | ICD-10-CM | POA: Diagnosis not present

## 2022-09-27 ENCOUNTER — Ambulatory Visit (INDEPENDENT_AMBULATORY_CARE_PROVIDER_SITE_OTHER): Payer: Medicare HMO | Admitting: Diagnostic Neuroimaging

## 2022-09-27 VITALS — BP 136/80 | HR 77 | Ht 70.0 in | Wt 137.6 lb

## 2022-09-27 DIAGNOSIS — R413 Other amnesia: Secondary | ICD-10-CM | POA: Diagnosis not present

## 2022-09-27 DIAGNOSIS — M6281 Muscle weakness (generalized): Secondary | ICD-10-CM | POA: Diagnosis not present

## 2022-09-27 NOTE — Patient Instructions (Signed)
MILD MEMORY LOSS + INTERMITTENT CONFUSION SPELLS - possible mild underlying dementia; some decline in ADLs prior to moving to assisted living; MMSE 21/30 --> 26/30; overall doing well currently at [facility] long term care center (assisted living) - continue donepezil - consider memantine '10mg'$  at bedtime; increase to twice a day after 1-2 weeks - safety / supervision issues reviewed - daily physical activity / exercise (at least 15-30 minutes) - eat more plants / vegetables - increase social activities, brain stimulation, games, puzzles, hobbies, crafts, arts, music - aim for at least 7-8 hours sleep per night (or more)

## 2022-09-27 NOTE — Progress Notes (Unsigned)
[facility]  PATIENT: Corey Frederick DOB: 04-Jan-1935  REFERRING CLINICIAN: Celene Squibb, MD HISTORY FROM: patient REASON FOR VISIT: new consult    HISTORICAL  CHIEF COMPLAINT:  Chief Complaint  Patient presents with   New Patient (Initial Visit)    Patient in room #7 with his caregiver. Pt here today to discuss his memory loss.    HISTORY OF PRESENT ILLNESS:   88 year old male here for evaluation of memory impairment.  Mild gradual progressive memory impairment, difficulty managing ADLs over the past 1 to 2 years.  Now living in assisted living since December 2023.  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  He grew up in [place].  REVIEW OF SYSTEMS: Full 14 system review of systems performed and negative with exception of: as per HPI  ALLERGIES: No Known Allergies  HOME MEDICATIONS: Outpatient Medications Prior to Visit  Medication Sig Dispense Refill   cetirizine (ZYRTEC) 10 MG tablet Take 10 mg by mouth daily.     clotrimazole (LOTRIMIN) 1 % cream Apply 1 Application topically 2 (two) times daily.     dextromethorphan (DELSYM) 30 MG/5ML liquid Take 30 mg by mouth as needed for cough.     donepezil (ARICEPT) 10 MG tablet Take 10 mg by mouth daily.     FLUoxetine (PROZAC) 10 MG capsule Take 10 mg by mouth daily.     GEMTESA 75 MG TABS Take by mouth.     ketoconazole (NIZORAL) 2 % cream Apply 1 Application topically daily.     LORazepam (ATIVAN) 1 MG tablet Take 1 mg by mouth at bedtime.     Metformin HCl 500 MG/5ML SOLN Take 5 mLs by mouth 2 (two) times daily.     pantoprazole (PROTONIX) 40 MG tablet Take 40 mg by mouth daily.     propranolol (INDERAL) 10 MG tablet Take 10 mg by mouth 2 (two) times daily.     ramipril (ALTACE) 5 MG capsule Take 5 mg by mouth daily.     rosuvastatin (CRESTOR) 10 MG tablet Take 10 mg by mouth daily.     triamcinolone ointment (KENALOG) 0.1 % Apply 1  Application topically 2 (two) times daily.     Vitamin D, Cholecalciferol, 1000 UNITS CAPS Take 1,000 Units by mouth daily.     HYDROcodone-acetaminophen (NORCO) 10-325 MG tablet Take 1 tablet by mouth every 6 (six) hours as needed. (Patient not taking: Reported on 08/05/2020) 40 tablet 0   No facility-administered medications prior to visit.    PAST MEDICAL HISTORY: Past Medical History:  Diagnosis Date   Anxiety    Depression    GERD (gastroesophageal reflux disease)    Hypertension    Pre-diabetes     PAST SURGICAL HISTORY: Past Surgical History:  Procedure Laterality Date   ANTERIOR CERVICAL DECOMP/DISCECTOMY FUSION N/A 07/28/2020   Procedure: [name] ANTERIOR CERVICAL DECOMPRESSION/DISCECTOMY FUSION, ALLOGRAFT, PLATE;  Surgeon: Marybelle Killings, MD;  Location: [facility];  Service: Orthopedics;  Laterality: N/A;   COLONOSCOPY     5 years ago - Dr. Oneida Alar   ESOPHAGOGASTRODUODENOSCOPY N/A 06/18/2015   Procedure: ESOPHAGOGASTRODUODENOSCOPY (EGD);  Surgeon: Rogene [place], MD;  [facility];  Service: Endoscopy;  Laterality: N/A;  155 - moved to 2:40 - [name] notified pt    FAMILY HISTORY: Family History  Problem Relation Age of Onset   Anxiety disorder Mother    Heart disease Father    Emphysema Sister  Diabetes Brother     SOCIAL HISTORY: Social History   Socioeconomic History   Marital status: Married    Spouse name: Not on file   Number of children: Not on file   Years of education: Not on file   Highest education level: Not on file  Occupational History   Not on file  Tobacco Use   Smoking status: Former   Smokeless tobacco: Never  Vaping Use   Vaping Use: Never used  Substance and Sexual Activity   Alcohol use: Yes    Alcohol/week: 0.0 standard drinks of alcohol    Comment: Social   Drug use: Never   Sexual activity: Not on file  Other Topics Concern   Not on file  Social History Narrative   Not on file   Social Determinants of Health    Financial Resource Strain: Not on file  Food Insecurity: Not on file  Transportation Needs: Not on file  Physical Activity: Not on file  Stress: Not on file  Social Connections: Not on file  Intimate Partner Violence: Not on file     PHYSICAL EXAM   GENERAL EXAM/CONSTITUTIONAL: Vitals:  Vitals:   09/27/22 1136  BP: 136/80  Pulse: 77  Weight: 137 lb 9.6 oz (62.4 kg)  Height: '5\' 10"'$  (1.778 m)   Body mass index is 19.74 kg/m. Wt Readings from Last 3 Encounters:  09/27/22 137 lb 9.6 oz (62.4 kg)  02/07/22 132 lb 15 oz (60.3 kg)  05/17/21 133 lb (60.3 kg)   Patient is in no distress; well developed, nourished and groomed; neck is supple  CARDIOVASCULAR: Examination of carotid arteries is normal; no carotid bruits Regular rate and rhythm, no murmurs Examination of peripheral vascular system by observation and palpation is normal  EYES: Ophthalmoscopic exam of optic discs and posterior segments is normal; no papilledema or hemorrhages No results found.  MUSCULOSKELETAL: Gait, strength, tone, movements noted in Neurologic exam below  NEUROLOGIC: MENTAL STATUS:     09/27/2022   11:36 AM  MMSE - Mini Mental State Exam  Orientation to time 2  Orientation to Place 5  Registration 3  Attention/ Calculation 5  Recall 3  Language- name 2 objects 2  Language- repeat 1  Language- follow 3 step command 3  Language- read & follow direction 1  Write a sentence 1  Copy design 0  Total score 26   awake, alert, oriented to person, place and time recent and remote memory intact normal attention and concentration language fluent, comprehension intact, naming intact fund of knowledge appropriate  CRANIAL NERVE:  2nd - no papilledema on fundoscopic exam 2nd, 3rd, 4th, 6th - pupils equal and reactive to light, visual fields full to confrontation, extraocular muscles intact, no nystagmus 5th - facial sensation symmetric 7th - facial strength symmetric 8th - hearing  intact 9th - palate elevates symmetrically, uvula midline 11th - shoulder shrug symmetric 12th - tongue protrusion midline  MOTOR:  normal bulk and tone, full strength in the BUE, BLE  SENSORY:  normal and symmetric to light touch, temperature, vibration  COORDINATION:  finger-nose-finger, fine finger movements normal  REFLEXES:  deep tendon reflexes present and symmetric  GAIT/STATION:  narrow based gait    DIAGNOSTIC DATA (LABS, IMAGING, TESTING) - I reviewed patient records, labs, notes, testing and imaging myself where available.  Lab Results  Component Value Date   WBC 5.4 02/07/2022   HGB 13.7 02/07/2022   HCT 37.3 (L) 02/07/2022   MCV 91.0 02/07/2022  PLT 138 (L) 02/07/2022      Component Value Date/Time   NA 142 02/07/2022 2135   K 3.4 (L) 02/07/2022 2135   CL 110 02/07/2022 2135   CO2 24 02/07/2022 2135   GLUCOSE 151 (H) 02/07/2022 2135   BUN 15 02/07/2022 2135   CREATININE 1.04 02/07/2022 2135   CALCIUM 8.7 (L) 02/07/2022 2135   PROT 6.0 (L) 02/07/2022 2135   ALBUMIN 3.6 02/07/2022 2135   AST 23 02/07/2022 2135   ALT 20 02/07/2022 2135   ALKPHOS 50 02/07/2022 2135   BILITOT 1.0 02/07/2022 2135   GFRNONAA >60 02/07/2022 2135   No results found for: "CHOL", "HDL", "LDLCALC", "LDLDIRECT", "TRIG", "CHOLHDL" Lab Results  Component Value Date   HGBA1C 6.4 (H) 07/26/2020   No results found for: "VITAMINB12" No results found for: "TSH"  02/23/22 MRI brain [I reviewed images myself and agree with interpretation. -VRP]  -No evidence of acute intracranial abnormality. -Minimal chronic small vessel ischemic changes within the left parietooccipital white matter. -Moderate generalized cerebral atrophy. Comparatively mild cerebellar atrophy.  ASSESSMENT AND PLAN  88 y.o. year old male here with:  Dx:  1. Memory loss     PLAN:  MILD MEMORY LOSS + INTERMITTENT CONFUSION SPELLS - possible mild underlying dementia; some decline in ADLs prior to  moving to assisted living; MMSE 21/30 --> 26/30; overall doing well currently at [facility] long term care center (assisted living) - continue donepezil - consider memantine '10mg'$  at bedtime; increase to twice a day after 1-2 weeks - safety / supervision issues reviewed - daily physical activity / exercise (at least 15-30 minutes) - eat more plants / vegetables - increase social activities, brain stimulation, games, puzzles, hobbies, crafts, arts, music - aim for at least 7-8 hours sleep per night (or more)  Return for return to PCP, pending if symptoms worsen or fail to improve.    Penni Bombard, MD [id number], 0000000 AM Certified in Neurology, Neurophysiology and Neuroimaging  [facility] [address], [place], [place] [address] [phone]

## 2022-09-28 ENCOUNTER — Encounter: Payer: Self-pay | Admitting: Diagnostic Neuroimaging

## 2022-09-28 DIAGNOSIS — R2689 Other abnormalities of gait and mobility: Secondary | ICD-10-CM | POA: Diagnosis not present

## 2022-10-02 DIAGNOSIS — M6281 Muscle weakness (generalized): Secondary | ICD-10-CM | POA: Diagnosis not present

## 2022-10-03 DIAGNOSIS — R2689 Other abnormalities of gait and mobility: Secondary | ICD-10-CM | POA: Diagnosis not present

## 2022-10-05 DIAGNOSIS — R2689 Other abnormalities of gait and mobility: Secondary | ICD-10-CM | POA: Diagnosis not present

## 2022-10-09 DIAGNOSIS — M6281 Muscle weakness (generalized): Secondary | ICD-10-CM | POA: Diagnosis not present

## 2022-10-10 DIAGNOSIS — R2689 Other abnormalities of gait and mobility: Secondary | ICD-10-CM | POA: Diagnosis not present

## 2022-10-11 DIAGNOSIS — M6281 Muscle weakness (generalized): Secondary | ICD-10-CM | POA: Diagnosis not present

## 2022-10-12 DIAGNOSIS — R2689 Other abnormalities of gait and mobility: Secondary | ICD-10-CM | POA: Diagnosis not present

## 2022-10-15 IMAGING — RF DG CERVICAL SPINE 2 OR 3 VIEWS
1 series · 2 of 2 positions shown · non-contrast
Comparison: MRI of the cervical spine February 03, 2020.

CLINICAL DATA: C5-C6 ACDF.

EXAM:
CERVICAL SPINE - 2-3 VIEW; DG C-ARM 1-60 MIN

[Series 1: unknown protocol · 0.14mm/px · 2 of 2 slices shown]
[im 1/2]
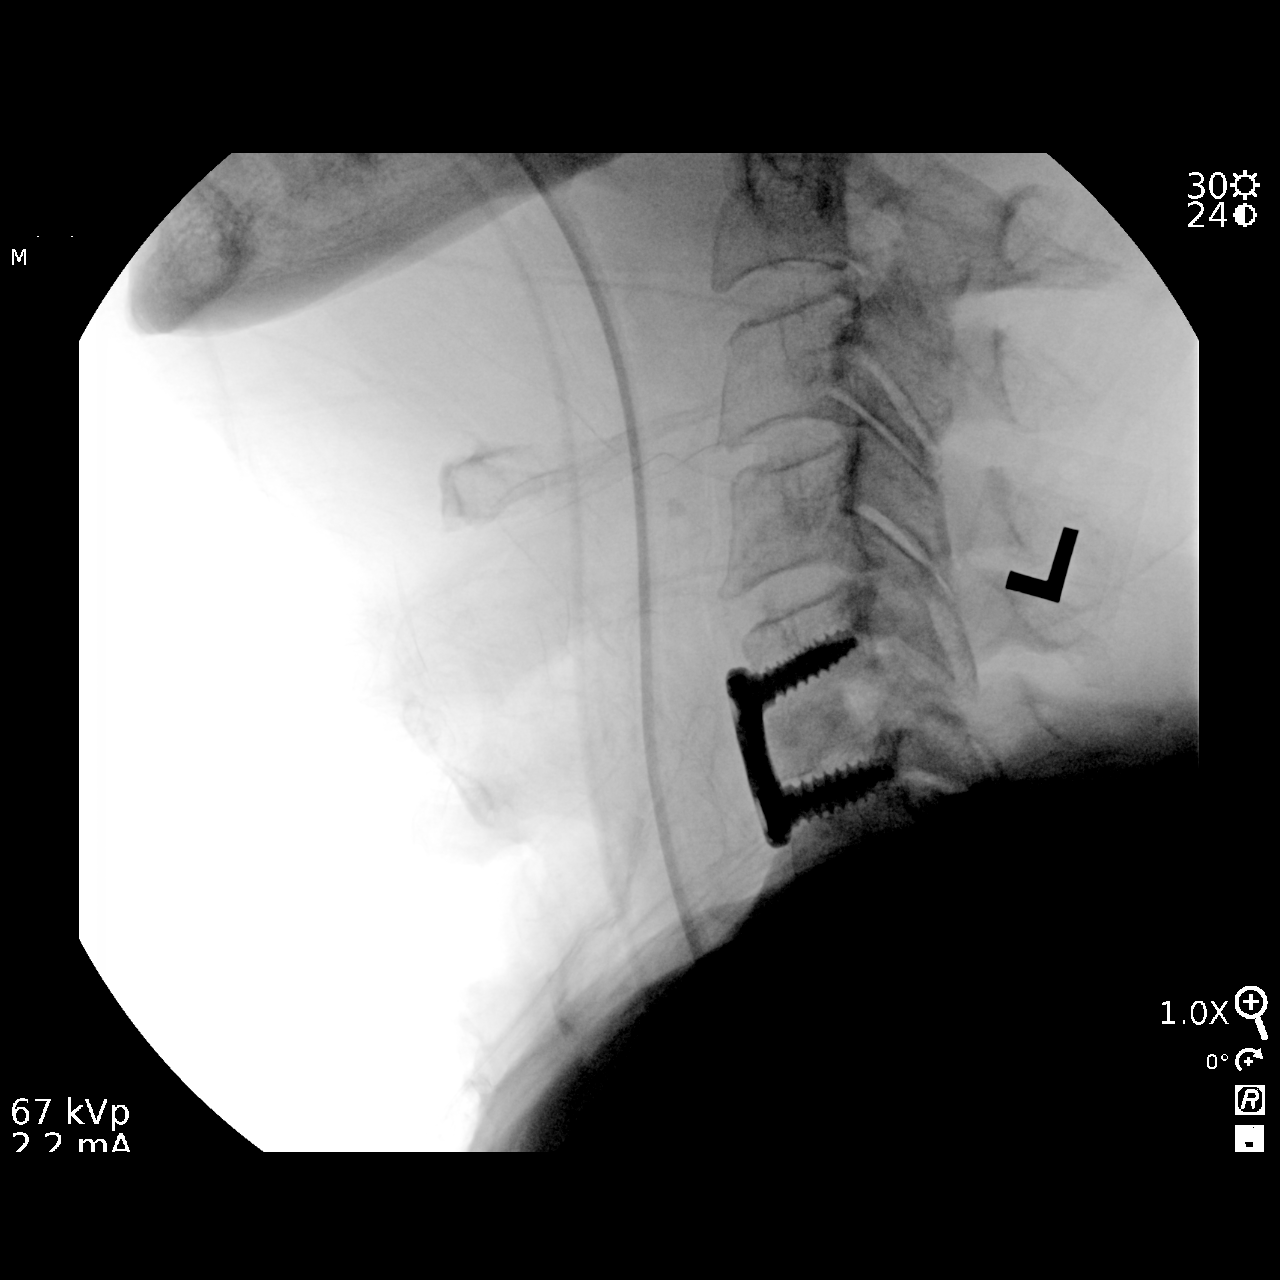
[im 2/2]
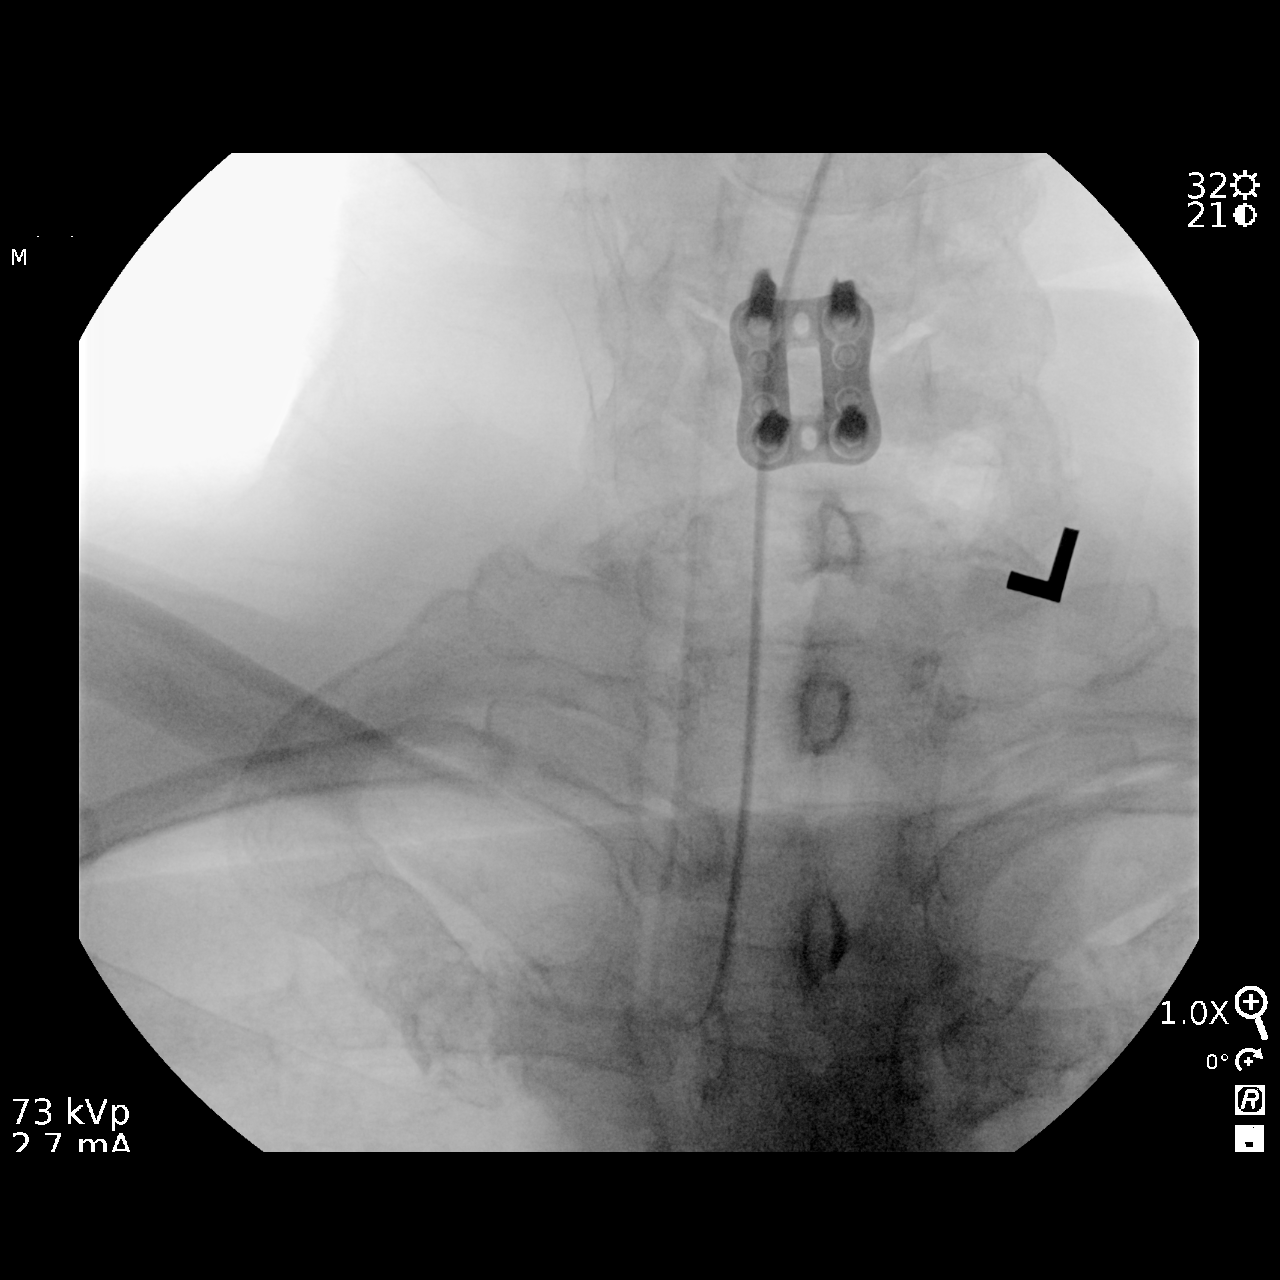

[2 of 2 positions shown; findings below may reference images not displayed]

FINDINGS: Fluoro time: 6 seconds.

Radiation: 0.62 mGy.

Two C-arm fluoroscopic images were obtained intraoperatively and
submitted for post operative interpretation. These images
demonstrate postsurgical changes of C5-C6 ACDF. Please see the
performing provider's procedural report for further detail.
IMPRESSION: Intraoperative fluoroscopic imaging, as detailed above

## 2022-10-16 DIAGNOSIS — R2689 Other abnormalities of gait and mobility: Secondary | ICD-10-CM | POA: Diagnosis not present

## 2022-10-16 DIAGNOSIS — M6281 Muscle weakness (generalized): Secondary | ICD-10-CM | POA: Diagnosis not present

## 2022-10-20 DIAGNOSIS — M6281 Muscle weakness (generalized): Secondary | ICD-10-CM | POA: Diagnosis not present

## 2022-10-23 DIAGNOSIS — R2689 Other abnormalities of gait and mobility: Secondary | ICD-10-CM | POA: Diagnosis not present

## 2022-10-23 DIAGNOSIS — M6281 Muscle weakness (generalized): Secondary | ICD-10-CM | POA: Diagnosis not present

## 2022-10-24 DIAGNOSIS — R2689 Other abnormalities of gait and mobility: Secondary | ICD-10-CM | POA: Diagnosis not present

## 2022-10-27 DIAGNOSIS — M6281 Muscle weakness (generalized): Secondary | ICD-10-CM | POA: Diagnosis not present

## 2022-10-31 DIAGNOSIS — R2689 Other abnormalities of gait and mobility: Secondary | ICD-10-CM | POA: Diagnosis not present

## 2022-11-02 DIAGNOSIS — R2689 Other abnormalities of gait and mobility: Secondary | ICD-10-CM | POA: Diagnosis not present

## 2022-11-03 DIAGNOSIS — M6281 Muscle weakness (generalized): Secondary | ICD-10-CM | POA: Diagnosis not present

## 2022-11-07 DIAGNOSIS — R2689 Other abnormalities of gait and mobility: Secondary | ICD-10-CM | POA: Diagnosis not present

## 2022-11-09 DIAGNOSIS — R2689 Other abnormalities of gait and mobility: Secondary | ICD-10-CM | POA: Diagnosis not present

## 2022-11-10 DIAGNOSIS — M6281 Muscle weakness (generalized): Secondary | ICD-10-CM | POA: Diagnosis not present

## 2022-11-14 DIAGNOSIS — R2689 Other abnormalities of gait and mobility: Secondary | ICD-10-CM | POA: Diagnosis not present

## 2022-11-16 DIAGNOSIS — R2689 Other abnormalities of gait and mobility: Secondary | ICD-10-CM | POA: Diagnosis not present

## 2022-11-17 DIAGNOSIS — M6281 Muscle weakness (generalized): Secondary | ICD-10-CM | POA: Diagnosis not present

## 2022-11-21 DIAGNOSIS — R2689 Other abnormalities of gait and mobility: Secondary | ICD-10-CM | POA: Diagnosis not present

## 2022-11-22 DIAGNOSIS — M6281 Muscle weakness (generalized): Secondary | ICD-10-CM | POA: Diagnosis not present

## 2022-11-23 DIAGNOSIS — R2689 Other abnormalities of gait and mobility: Secondary | ICD-10-CM | POA: Diagnosis not present

## 2022-11-27 DIAGNOSIS — M6281 Muscle weakness (generalized): Secondary | ICD-10-CM | POA: Diagnosis not present

## 2022-11-28 DIAGNOSIS — L989 Disorder of the skin and subcutaneous tissue, unspecified: Secondary | ICD-10-CM | POA: Diagnosis not present

## 2022-11-28 DIAGNOSIS — R2689 Other abnormalities of gait and mobility: Secondary | ICD-10-CM | POA: Diagnosis not present

## 2022-11-28 DIAGNOSIS — R413 Other amnesia: Secondary | ICD-10-CM | POA: Diagnosis not present

## 2022-11-28 DIAGNOSIS — R6889 Other general symptoms and signs: Secondary | ICD-10-CM | POA: Diagnosis not present

## 2022-11-28 DIAGNOSIS — R197 Diarrhea, unspecified: Secondary | ICD-10-CM | POA: Diagnosis not present

## 2022-11-30 DIAGNOSIS — R2689 Other abnormalities of gait and mobility: Secondary | ICD-10-CM | POA: Diagnosis not present

## 2022-12-04 DIAGNOSIS — M6281 Muscle weakness (generalized): Secondary | ICD-10-CM | POA: Diagnosis not present

## 2022-12-05 DIAGNOSIS — R2689 Other abnormalities of gait and mobility: Secondary | ICD-10-CM | POA: Diagnosis not present

## 2022-12-06 DIAGNOSIS — M6281 Muscle weakness (generalized): Secondary | ICD-10-CM | POA: Diagnosis not present

## 2022-12-07 DIAGNOSIS — R2689 Other abnormalities of gait and mobility: Secondary | ICD-10-CM | POA: Diagnosis not present

## 2022-12-12 DIAGNOSIS — R2689 Other abnormalities of gait and mobility: Secondary | ICD-10-CM | POA: Diagnosis not present

## 2022-12-14 DIAGNOSIS — R2689 Other abnormalities of gait and mobility: Secondary | ICD-10-CM | POA: Diagnosis not present

## 2022-12-15 DIAGNOSIS — M6281 Muscle weakness (generalized): Secondary | ICD-10-CM | POA: Diagnosis not present

## 2022-12-18 DIAGNOSIS — M6281 Muscle weakness (generalized): Secondary | ICD-10-CM | POA: Diagnosis not present

## 2022-12-19 DIAGNOSIS — R2689 Other abnormalities of gait and mobility: Secondary | ICD-10-CM | POA: Diagnosis not present

## 2022-12-26 DIAGNOSIS — R2689 Other abnormalities of gait and mobility: Secondary | ICD-10-CM | POA: Diagnosis not present

## 2022-12-28 DIAGNOSIS — R2689 Other abnormalities of gait and mobility: Secondary | ICD-10-CM | POA: Diagnosis not present

## 2023-01-02 DIAGNOSIS — R2689 Other abnormalities of gait and mobility: Secondary | ICD-10-CM | POA: Diagnosis not present

## 2023-01-04 DIAGNOSIS — R2689 Other abnormalities of gait and mobility: Secondary | ICD-10-CM | POA: Diagnosis not present

## 2023-01-17 ENCOUNTER — Other Ambulatory Visit (HOSPITAL_COMMUNITY): Payer: Self-pay | Admitting: Family Medicine

## 2023-01-17 DIAGNOSIS — R0989 Other specified symptoms and signs involving the circulatory and respiratory systems: Secondary | ICD-10-CM | POA: Diagnosis not present

## 2023-01-17 DIAGNOSIS — J309 Allergic rhinitis, unspecified: Secondary | ICD-10-CM | POA: Diagnosis not present

## 2023-01-17 DIAGNOSIS — N400 Enlarged prostate without lower urinary tract symptoms: Secondary | ICD-10-CM | POA: Diagnosis not present

## 2023-01-17 DIAGNOSIS — R350 Frequency of micturition: Secondary | ICD-10-CM | POA: Diagnosis not present

## 2023-01-17 DIAGNOSIS — R09A2 Foreign body sensation, throat: Secondary | ICD-10-CM

## 2023-01-22 DIAGNOSIS — R2689 Other abnormalities of gait and mobility: Secondary | ICD-10-CM | POA: Diagnosis not present

## 2023-01-24 DIAGNOSIS — R2689 Other abnormalities of gait and mobility: Secondary | ICD-10-CM | POA: Diagnosis not present

## 2023-01-29 DIAGNOSIS — R2689 Other abnormalities of gait and mobility: Secondary | ICD-10-CM | POA: Diagnosis not present

## 2023-01-30 DIAGNOSIS — R2689 Other abnormalities of gait and mobility: Secondary | ICD-10-CM | POA: Diagnosis not present

## 2023-02-05 DIAGNOSIS — R2689 Other abnormalities of gait and mobility: Secondary | ICD-10-CM | POA: Diagnosis not present

## 2023-02-07 DIAGNOSIS — R2689 Other abnormalities of gait and mobility: Secondary | ICD-10-CM | POA: Diagnosis not present

## 2023-02-12 DIAGNOSIS — R2689 Other abnormalities of gait and mobility: Secondary | ICD-10-CM | POA: Diagnosis not present

## 2023-02-14 DIAGNOSIS — R2689 Other abnormalities of gait and mobility: Secondary | ICD-10-CM | POA: Diagnosis not present

## 2023-02-19 DIAGNOSIS — Z0100 Encounter for examination of eyes and vision without abnormal findings: Secondary | ICD-10-CM | POA: Diagnosis not present

## 2023-02-19 DIAGNOSIS — R2689 Other abnormalities of gait and mobility: Secondary | ICD-10-CM | POA: Diagnosis not present

## 2023-02-19 DIAGNOSIS — H25813 Combined forms of age-related cataract, bilateral: Secondary | ICD-10-CM | POA: Diagnosis not present

## 2023-02-21 DIAGNOSIS — R2689 Other abnormalities of gait and mobility: Secondary | ICD-10-CM | POA: Diagnosis not present

## 2023-02-26 DIAGNOSIS — R2689 Other abnormalities of gait and mobility: Secondary | ICD-10-CM | POA: Diagnosis not present

## 2023-02-28 DIAGNOSIS — R2689 Other abnormalities of gait and mobility: Secondary | ICD-10-CM | POA: Diagnosis not present

## 2023-03-06 DIAGNOSIS — R2689 Other abnormalities of gait and mobility: Secondary | ICD-10-CM | POA: Diagnosis not present

## 2023-03-07 DIAGNOSIS — R2689 Other abnormalities of gait and mobility: Secondary | ICD-10-CM | POA: Diagnosis not present

## 2023-03-09 ENCOUNTER — Ambulatory Visit (HOSPITAL_COMMUNITY)
Admission: RE | Admit: 2023-03-09 | Discharge: 2023-03-09 | Disposition: A | Discharge status: Home / Self Care | Payer: Medicare HMO | Source: Ambulatory Visit | Attending: Family Medicine | Admitting: Family Medicine

## 2023-03-09 DIAGNOSIS — Z981 Arthrodesis status: Secondary | ICD-10-CM | POA: Diagnosis not present

## 2023-03-09 DIAGNOSIS — Z0389 Encounter for observation for other suspected diseases and conditions ruled out: Secondary | ICD-10-CM | POA: Diagnosis not present

## 2023-03-09 DIAGNOSIS — E034 Atrophy of thyroid (acquired): Secondary | ICD-10-CM | POA: Diagnosis not present

## 2023-03-09 DIAGNOSIS — R09A2 Foreign body sensation, throat: Secondary | ICD-10-CM | POA: Insufficient documentation

## 2023-03-09 MED ORDER — IOHEXOL 300 MG/ML  SOLN
75.0000 mL | Freq: Once | INTRAMUSCULAR | Status: AC | PRN
  Administered 2023-03-09: 75 mL via INTRAVENOUS

## 2023-03-12 DIAGNOSIS — R2689 Other abnormalities of gait and mobility: Secondary | ICD-10-CM | POA: Diagnosis not present

## 2023-03-13 DIAGNOSIS — E1165 Type 2 diabetes mellitus with hyperglycemia: Secondary | ICD-10-CM | POA: Diagnosis not present

## 2023-03-13 DIAGNOSIS — E876 Hypokalemia: Secondary | ICD-10-CM | POA: Diagnosis not present

## 2023-03-13 DIAGNOSIS — I129 Hypertensive chronic kidney disease with stage 1 through stage 4 chronic kidney disease, or unspecified chronic kidney disease: Secondary | ICD-10-CM | POA: Diagnosis not present

## 2023-03-14 DIAGNOSIS — R2689 Other abnormalities of gait and mobility: Secondary | ICD-10-CM | POA: Diagnosis not present

## 2023-03-19 DIAGNOSIS — R2689 Other abnormalities of gait and mobility: Secondary | ICD-10-CM | POA: Diagnosis not present

## 2023-03-20 DIAGNOSIS — R350 Frequency of micturition: Secondary | ICD-10-CM | POA: Diagnosis not present

## 2023-03-20 DIAGNOSIS — E1165 Type 2 diabetes mellitus with hyperglycemia: Secondary | ICD-10-CM | POA: Diagnosis not present

## 2023-03-20 DIAGNOSIS — L989 Disorder of the skin and subcutaneous tissue, unspecified: Secondary | ICD-10-CM | POA: Diagnosis not present

## 2023-03-20 DIAGNOSIS — J302 Other seasonal allergic rhinitis: Secondary | ICD-10-CM | POA: Diagnosis not present

## 2023-03-20 DIAGNOSIS — R413 Other amnesia: Secondary | ICD-10-CM | POA: Diagnosis not present

## 2023-03-20 DIAGNOSIS — J309 Allergic rhinitis, unspecified: Secondary | ICD-10-CM | POA: Diagnosis not present

## 2023-03-20 DIAGNOSIS — N401 Enlarged prostate with lower urinary tract symptoms: Secondary | ICD-10-CM | POA: Diagnosis not present

## 2023-03-20 DIAGNOSIS — R197 Diarrhea, unspecified: Secondary | ICD-10-CM | POA: Diagnosis not present

## 2023-03-20 DIAGNOSIS — N400 Enlarged prostate without lower urinary tract symptoms: Secondary | ICD-10-CM | POA: Diagnosis not present

## 2023-03-21 DIAGNOSIS — R2689 Other abnormalities of gait and mobility: Secondary | ICD-10-CM | POA: Diagnosis not present

## 2023-03-27 DIAGNOSIS — R2689 Other abnormalities of gait and mobility: Secondary | ICD-10-CM | POA: Diagnosis not present

## 2023-03-28 DIAGNOSIS — R2689 Other abnormalities of gait and mobility: Secondary | ICD-10-CM | POA: Diagnosis not present

## 2023-04-04 DIAGNOSIS — R2689 Other abnormalities of gait and mobility: Secondary | ICD-10-CM | POA: Diagnosis not present

## 2023-04-05 DIAGNOSIS — R2689 Other abnormalities of gait and mobility: Secondary | ICD-10-CM | POA: Diagnosis not present

## 2023-04-09 DIAGNOSIS — R2689 Other abnormalities of gait and mobility: Secondary | ICD-10-CM | POA: Diagnosis not present

## 2023-04-11 DIAGNOSIS — R2689 Other abnormalities of gait and mobility: Secondary | ICD-10-CM | POA: Diagnosis not present

## 2023-04-13 ENCOUNTER — Encounter (INDEPENDENT_AMBULATORY_CARE_PROVIDER_SITE_OTHER): Payer: Self-pay | Admitting: *Deleted

## 2023-04-16 DIAGNOSIS — R2689 Other abnormalities of gait and mobility: Secondary | ICD-10-CM | POA: Diagnosis not present

## 2023-04-18 DIAGNOSIS — R2689 Other abnormalities of gait and mobility: Secondary | ICD-10-CM | POA: Diagnosis not present

## 2023-04-20 DIAGNOSIS — M545 Low back pain, unspecified: Secondary | ICD-10-CM | POA: Diagnosis not present

## 2023-04-23 DIAGNOSIS — R2689 Other abnormalities of gait and mobility: Secondary | ICD-10-CM | POA: Diagnosis not present

## 2023-04-24 DIAGNOSIS — R2689 Other abnormalities of gait and mobility: Secondary | ICD-10-CM | POA: Diagnosis not present

## 2023-04-30 ENCOUNTER — Encounter (INDEPENDENT_AMBULATORY_CARE_PROVIDER_SITE_OTHER): Payer: Self-pay | Admitting: Gastroenterology

## 2023-04-30 ENCOUNTER — Ambulatory Visit (INDEPENDENT_AMBULATORY_CARE_PROVIDER_SITE_OTHER): Payer: Medicare HMO | Admitting: Gastroenterology

## 2023-04-30 VITALS — BP 124/65 | HR 71 | Temp 98.3°F | Ht 71.0 in | Wt 151.1 lb

## 2023-04-30 DIAGNOSIS — R2689 Other abnormalities of gait and mobility: Secondary | ICD-10-CM | POA: Diagnosis not present

## 2023-04-30 DIAGNOSIS — K219 Gastro-esophageal reflux disease without esophagitis: Secondary | ICD-10-CM | POA: Diagnosis not present

## 2023-04-30 DIAGNOSIS — R1013 Epigastric pain: Secondary | ICD-10-CM

## 2023-04-30 DIAGNOSIS — J387 Other diseases of larynx: Secondary | ICD-10-CM | POA: Diagnosis not present

## 2023-04-30 NOTE — Patient Instructions (Signed)
It was very nice to meet you today, as dicussed with will plan for the following :  1) Upper endoscopy  2) referral to ENT

## 2023-04-30 NOTE — Progress Notes (Signed)
[name] , M.D. Gastroenterology & Hepatology [facility] [address] [place], [place] [address] Primary Care Physician: Benita Stabile, MD [address] [place] [address]  Chief Complaint:  Sensation in the thorat , abdominal bloating and lack to appetite  History of Present Illness: Corey Frederick is a 88 y.o. male with anxiety, depression, HTN who presents for evaluation of Sensation in the thorat , abdominal bloating and lack to appetite  By wife in the clinic.  Reports that he has sensation in his throat with frequent throat clearing.  Has reflux which he takes antacid quite often.  Patient states that he does not have much of an appetite and probably because of the food he is getting at the facility.  Although weight is stable patient has epigastric bloating after eating food.  He reports good bowel daily without any straining the patient denies having any nausea, vomiting, fever, chills, hematochezia, melena, hematemesis,  diarrhea, jaundice, pruritus or weight loss.  Last EGD:2016  Mildly dilated body of the esophagus. Small sliding hiatal hernia. Markedly dilated stomach with deformed and narrowed pylorus. Limited examination of duodenal bulb and second part of the duodenum could not be examined.  Comment: I am concerned he may have GI pseudoobstruction.  Recommendations:  Upper GI series to be scheduled next week.  Last Colonoscopy:over 10 years ago , no records available   FHx: neg for any gastrointestinal/liver disease, no malignancies Social: neg smoking, alcohol or illicit drug use  Past Medical History: Past Medical History:  Diagnosis Date   Anxiety    Depression    GERD (gastroesophageal reflux disease)    Hypertension    Pre-diabetes     Past Surgical History: Past Surgical History:  Procedure Laterality Date   ANTERIOR CERVICAL DECOMP/DISCECTOMY FUSION N/A 07/28/2020   Procedure:  [name] ANTERIOR CERVICAL DECOMPRESSION/DISCECTOMY FUSION, ALLOGRAFT, PLATE;  Surgeon: Eldred Manges, MD;  Location: [facility];  Service: Orthopedics;  Laterality: N/A;   COLONOSCOPY     5 years ago - Dr. Darrick Penna   ESOPHAGOGASTRODUODENOSCOPY N/A 06/18/2015   Procedure: ESOPHAGOGASTRODUODENOSCOPY (EGD);  Surgeon: Malissa Hippo, MD;  [facility];  Service: Endoscopy;  Laterality: N/A;  155 - moved to 2:40 - [name] notified pt    Family History: Family History  Problem Relation Age of Onset   Anxiety disorder Mother    Heart disease Father    Emphysema Sister    Diabetes Brother     Social History: Social History   Tobacco Use  Smoking Status Former  Smokeless Tobacco Never   Social History   Substance and Sexual Activity  Alcohol Use Yes   Alcohol/week: 0.0 standard drinks of alcohol   Comment: Social   Social History   Substance and Sexual Activity  Drug Use Never    Allergies: No Known Allergies  Medications: Current Outpatient Medications  Medication Sig Dispense Refill   acetaminophen (TYLENOL) 500 MG tablet Take 500 mg by mouth every 8 (eight) hours as needed.     donepezil (ARICEPT) 10 MG tablet Take 10 mg by mouth daily.     FLUoxetine (PROZAC) 10 MG capsule Take 10 mg by mouth daily.     GEMTESA 75 MG TABS Take by mouth daily.     levocetirizine (XYZAL) 5 MG tablet Take 5 mg by mouth every evening.     levothyroxine (SYNTHROID) 25 MCG tablet Take 25 mcg by mouth daily before breakfast.  loperamide (IMODIUM) 2 MG capsule Take by mouth as needed for diarrhea or loose stools.     LORazepam (ATIVAN) 1 MG tablet Take 1 mg by mouth at bedtime.     meloxicam (MOBIC) 15 MG tablet Take 15 mg by mouth daily.     memantine (NAMENDA) 10 MG tablet Take 10 mg by mouth at bedtime.     Metformin HCl 500 MG/5ML SOLN Take 5 mLs by mouth 2 (two) times daily.     pantoprazole (PROTONIX) 40 MG tablet Take 40 mg by mouth daily.     propranolol (INDERAL) 10 MG  tablet Take 10 mg by mouth 2 (two) times daily.     ramipril (ALTACE) 5 MG capsule Take 5 mg by mouth daily.     rosuvastatin (CRESTOR) 10 MG tablet Take 10 mg by mouth daily.     sildenafil (VIAGRA) 50 MG tablet Take 50 mg by mouth daily as needed for erectile dysfunction.     tamsulosin (FLOMAX) 0.4 MG CAPS capsule Take 0.4 mg by mouth daily.     No current facility-administered medications for this visit.    Review of Systems: GENERAL: negative for malaise, night sweats HEENT: No changes in hearing or vision, no nose bleeds or other nasal problems. NECK: Negative for lumps, goiter, pain and significant neck swelling RESPIRATORY: Negative for cough, wheezing CARDIOVASCULAR: Negative for chest pain, leg swelling, palpitations, orthopnea GI: SEE HPI MUSCULOSKELETAL: Negative for joint pain or swelling, back pain, and muscle pain. SKIN: Negative for lesions, rash HEMATOLOGY Negative for prolonged bleeding, bruising easily, and swollen nodes. ENDOCRINE: Negative for cold or heat intolerance, polyuria, polydipsia and goiter. NEURO: negative for tremor, gait imbalance, syncope and seizures. The remainder of the review of systems is noncontributory.   Physical Exam: BP 124/65 (BP Location: Left Arm, Patient Position: Sitting, Cuff Size: Normal)   Pulse 71   Temp 98.3 F (36.8 C) (Oral)   Ht 5\' 11"  (1.803 m)   Wt 151 lb 1.6 oz (68.5 kg)   BMI 21.07 kg/m  GENERAL: The patient is AO x3, in no acute distress. HEENT: Head is normocephalic and atraumatic. EOMI are intact. Mouth is well hydrated and without lesions. NECK: Supple. No masses LUNGS: Clear to auscultation. No presence of rhonchi/wheezing/rales. Adequate chest expansion HEART: RRR, normal s1 and s2. ABDOMEN: Soft, nontender, no guarding, no peritoneal signs, and nondistended. BS +. No masses. EXTREMITIES: Without any cyanosis, clubbing, rash, lesions or edema. NEUROLOGIC: AOx3, no focal motor deficit. SKIN: no jaundice, no  rashes   Imaging/Labs: as above     Latest Ref Rng & Units 02/07/2022    7:38 PM 05/17/2021   11:53 PM 07/26/2020    2:00 PM  CBC  WBC 4.0 - 10.5 K/uL 5.4  5.6  6.5   Hemoglobin 13.0 - 17.0 g/dL 16.1  09.6  04.5   Hematocrit 39.0 - 52.0 % 37.3  40.9  43.7   Platelets 150 - 400 K/uL 138  221  221    No results found for: "IRON", "TIBC", "FERRITIN"  I personally reviewed and interpreted the available labs, imaging and endoscopic files.  CT Soft tissue NECK  1. Small low densities at the vallecula, usually retention cysts. These could be evaluated with endoscopy. 2. No adenopathy or deep space mass throughout the neck.  Impression and Plan:  Corey Frederick is a 88 y.o. male with anxiety, depression, HTN who presents for evaluation of Sensation in the thorat , abdominal bloating and lack to appetite  #  Dyspepsia  #Globus sensation #Chronic GERD   Patient has abdominal bloating and dyspepsia like symptoms.  Given above the age of 37 last upper endoscopy in 2016 which was actually not a complete endoscopy as duodenum could not be examined  Will proceed with upper endoscopy with pediatric colonoscope to ensure to examine the duodenum  Also patient has longstanding history of GERD with increasing antacid use and evaluate for complications of GERD such as Barrett's, esophagitis, peptic stricture  Regarding globus sensation it could be because of possible retention vallecula cyst seen on CT neck.  Will refer for ENT for throat examination  Proceed with diagnostic upper endoscopy with pediatric colonoscope  Protonix daily 30 minutes before breakfast  All questions were answered.      [name], MD Gastroenterology and Hepatology [facility]   This chart has been completed using [facility] Dictation software, and while attempts have been made to ensure accuracy , certain words and phrases may not be transcribed as intended

## 2023-05-01 ENCOUNTER — Telehealth (INDEPENDENT_AMBULATORY_CARE_PROVIDER_SITE_OTHER): Payer: Self-pay | Admitting: Gastroenterology

## 2023-05-01 ENCOUNTER — Encounter (INDEPENDENT_AMBULATORY_CARE_PROVIDER_SITE_OTHER): Payer: Self-pay

## 2023-05-01 NOTE — Telephone Encounter (Signed)
Instructions and pre op faxed to [facility] at [phone]. Mailed pre op to wife also.

## 2023-05-02 DIAGNOSIS — R2689 Other abnormalities of gait and mobility: Secondary | ICD-10-CM | POA: Diagnosis not present

## 2023-05-07 DIAGNOSIS — R2689 Other abnormalities of gait and mobility: Secondary | ICD-10-CM | POA: Diagnosis not present

## 2023-05-09 DIAGNOSIS — R2689 Other abnormalities of gait and mobility: Secondary | ICD-10-CM | POA: Diagnosis not present

## 2023-05-14 DIAGNOSIS — R2689 Other abnormalities of gait and mobility: Secondary | ICD-10-CM | POA: Diagnosis not present

## 2023-05-16 DIAGNOSIS — R2689 Other abnormalities of gait and mobility: Secondary | ICD-10-CM | POA: Diagnosis not present

## 2023-05-21 DIAGNOSIS — R2689 Other abnormalities of gait and mobility: Secondary | ICD-10-CM | POA: Diagnosis not present

## 2023-05-23 DIAGNOSIS — R2689 Other abnormalities of gait and mobility: Secondary | ICD-10-CM | POA: Diagnosis not present

## 2023-05-24 ENCOUNTER — Institutional Professional Consult (permissible substitution) (INDEPENDENT_AMBULATORY_CARE_PROVIDER_SITE_OTHER): Payer: Medicare HMO

## 2023-05-24 ENCOUNTER — Encounter (INDEPENDENT_AMBULATORY_CARE_PROVIDER_SITE_OTHER): Payer: Self-pay

## 2023-05-28 DIAGNOSIS — R2689 Other abnormalities of gait and mobility: Secondary | ICD-10-CM | POA: Diagnosis not present

## 2023-05-29 ENCOUNTER — Encounter (HOSPITAL_COMMUNITY): Payer: Self-pay

## 2023-05-29 ENCOUNTER — Encounter (HOSPITAL_COMMUNITY)
Admission: RE | Admit: 2023-05-29 | Discharge: 2023-05-29 | Disposition: A | Discharge status: Home / Self Care | Payer: Medicare HMO | Source: Ambulatory Visit | Attending: Gastroenterology | Admitting: Gastroenterology

## 2023-05-29 HISTORY — DX: Hypothyroidism, unspecified: E03.9

## 2023-05-30 DIAGNOSIS — R2689 Other abnormalities of gait and mobility: Secondary | ICD-10-CM | POA: Diagnosis not present

## 2023-05-31 ENCOUNTER — Encounter (HOSPITAL_COMMUNITY): Payer: Self-pay

## 2023-05-31 ENCOUNTER — Encounter (HOSPITAL_COMMUNITY)
Admission: RE | Disposition: A | Discharge status: Home / Self Care | Payer: Self-pay | Source: Home / Self Care | Attending: Gastroenterology

## 2023-05-31 ENCOUNTER — Other Ambulatory Visit: Payer: Self-pay

## 2023-05-31 ENCOUNTER — Ambulatory Visit (HOSPITAL_COMMUNITY): Payer: Medicare HMO | Admitting: Certified Registered"

## 2023-05-31 ENCOUNTER — Ambulatory Visit (HOSPITAL_BASED_OUTPATIENT_CLINIC_OR_DEPARTMENT_OTHER): Payer: Medicare HMO | Admitting: Certified Registered"

## 2023-05-31 ENCOUNTER — Ambulatory Visit (HOSPITAL_COMMUNITY)
Admission: RE | Admit: 2023-05-31 | Discharge: 2023-05-31 | Disposition: A | Discharge status: Home / Self Care | Payer: Medicare HMO | Attending: Gastroenterology | Admitting: Gastroenterology

## 2023-05-31 DIAGNOSIS — F419 Anxiety disorder, unspecified: Secondary | ICD-10-CM | POA: Diagnosis not present

## 2023-05-31 DIAGNOSIS — K311 Adult hypertrophic pyloric stenosis: Secondary | ICD-10-CM

## 2023-05-31 DIAGNOSIS — R630 Anorexia: Secondary | ICD-10-CM | POA: Diagnosis not present

## 2023-05-31 DIAGNOSIS — R1310 Dysphagia, unspecified: Secondary | ICD-10-CM | POA: Diagnosis not present

## 2023-05-31 DIAGNOSIS — K30 Functional dyspepsia: Secondary | ICD-10-CM | POA: Diagnosis not present

## 2023-05-31 DIAGNOSIS — Z87891 Personal history of nicotine dependence: Secondary | ICD-10-CM | POA: Insufficient documentation

## 2023-05-31 DIAGNOSIS — F32A Depression, unspecified: Secondary | ICD-10-CM | POA: Diagnosis not present

## 2023-05-31 DIAGNOSIS — K2950 Unspecified chronic gastritis without bleeding: Secondary | ICD-10-CM | POA: Diagnosis not present

## 2023-05-31 DIAGNOSIS — K2970 Gastritis, unspecified, without bleeding: Secondary | ICD-10-CM

## 2023-05-31 DIAGNOSIS — I10 Essential (primary) hypertension: Secondary | ICD-10-CM | POA: Insufficient documentation

## 2023-05-31 DIAGNOSIS — E039 Hypothyroidism, unspecified: Secondary | ICD-10-CM | POA: Diagnosis not present

## 2023-05-31 DIAGNOSIS — G709 Myoneural disorder, unspecified: Secondary | ICD-10-CM | POA: Diagnosis not present

## 2023-05-31 DIAGNOSIS — F458 Other somatoform disorders: Secondary | ICD-10-CM | POA: Diagnosis present

## 2023-05-31 DIAGNOSIS — Q403 Congenital malformation of stomach, unspecified: Secondary | ICD-10-CM | POA: Diagnosis not present

## 2023-05-31 DIAGNOSIS — K2289 Other specified disease of esophagus: Secondary | ICD-10-CM | POA: Diagnosis not present

## 2023-05-31 DIAGNOSIS — R09A2 Foreign body sensation, throat: Secondary | ICD-10-CM

## 2023-05-31 DIAGNOSIS — R140 Abdominal distension (gaseous): Secondary | ICD-10-CM

## 2023-05-31 DIAGNOSIS — K219 Gastro-esophageal reflux disease without esophagitis: Secondary | ICD-10-CM | POA: Insufficient documentation

## 2023-05-31 DIAGNOSIS — K3189 Other diseases of stomach and duodenum: Secondary | ICD-10-CM | POA: Diagnosis not present

## 2023-05-31 HISTORY — PX: BIOPSY: SHX5522

## 2023-05-31 HISTORY — PX: BALLOON DILATION: SHX5330

## 2023-05-31 HISTORY — PX: ESOPHAGOGASTRODUODENOSCOPY (EGD) WITH PROPOFOL: SHX5813

## 2023-05-31 LAB — GLUCOSE, CAPILLARY: Glucose-Capillary: 154 mg/dL — ABNORMAL HIGH (ref 70–99)

## 2023-05-31 SURGERY — ESOPHAGOGASTRODUODENOSCOPY (EGD) WITH PROPOFOL
Anesthesia: General

## 2023-05-31 MED ORDER — LACTATED RINGERS IV SOLN: INTRAVENOUS | Status: DC | PRN

## 2023-05-31 MED ORDER — PHENYLEPHRINE 80 MCG/ML (10ML) SYRINGE FOR IV PUSH (FOR BLOOD PRESSURE SUPPORT)
PREFILLED_SYRINGE | INTRAVENOUS | Status: AC
  Filled 2023-05-31: qty 10

## 2023-05-31 MED ORDER — EPHEDRINE SULFATE-NACL 50-0.9 MG/10ML-% IV SOSY
PREFILLED_SYRINGE | INTRAVENOUS | Status: DC | PRN
  Administered 2023-05-31: 5 mg via INTRAVENOUS

## 2023-05-31 MED ORDER — OMEPRAZOLE 40 MG PO CPDR: 40.0000 mg | DELAYED_RELEASE_CAPSULE | Freq: Every day | ORAL | 3 refills | Status: DC

## 2023-05-31 MED ORDER — PROPOFOL 500 MG/50ML IV EMUL
INTRAVENOUS | Status: DC | PRN
  Administered 2023-05-31: 100 ug/kg/min via INTRAVENOUS

## 2023-05-31 MED ORDER — PROPOFOL 10 MG/ML IV BOLUS
INTRAVENOUS | Status: DC | PRN
  Administered 2023-05-31 (×2): 40 mg via INTRAVENOUS
  Administered 2023-05-31: 100 mg via INTRAVENOUS

## 2023-05-31 MED ORDER — LIDOCAINE HCL (CARDIAC) PF 100 MG/5ML IV SOSY
PREFILLED_SYRINGE | INTRAVENOUS | Status: DC | PRN
  Administered 2023-05-31: 50 mg via INTRAVENOUS

## 2023-05-31 MED ORDER — EPHEDRINE 5 MG/ML INJ
INTRAVENOUS | Status: AC
  Filled 2023-05-31: qty 5

## 2023-05-31 MED ORDER — PHENYLEPHRINE 80 MCG/ML (10ML) SYRINGE FOR IV PUSH (FOR BLOOD PRESSURE SUPPORT)
PREFILLED_SYRINGE | INTRAVENOUS | Status: DC | PRN
  Administered 2023-05-31: 160 ug via INTRAVENOUS

## 2023-05-31 NOTE — Anesthesia Procedure Notes (Signed)
Date/Time: 05/31/2023 11:17 AM  Performed by: Julian Reil, CRNAPre-anesthesia Checklist: Patient identified, Emergency Drugs available, Suction available and Patient being monitored Patient Re-evaluated:Patient Re-evaluated prior to induction Oxygen Delivery Method: Nasal cannula Induction Type: IV induction Placement Confirmation: positive ETCO2

## 2023-05-31 NOTE — H&P (Signed)
Primary Care Physician:  Benita Stabile, MD Primary Gastroenterologist:  Dr. Tasia Catchings  Pre-Procedure History & Physical: HPI:  Corey Frederick is a 88 y.o. male with anxiety, depression, HTN who presents for evaluation of Sensation in the thorat , abdominal bloating and lack to appetite  By wife in the clinic.  Reports that he has sensation in his throat with frequent throat clearing.  Has reflux which he takes antacid quite often.  Patient states that he does not have much of an appetite and probably because of the food he is getting at the facility.  Although weight is stable patient has epigastric bloating after eating food.  He reports good bowel daily without any straining the patient denies having any nausea, vomiting, fever, chills, hematochezia, melena, hematemesis,  diarrhea, jaundice, pruritus or weight loss.   Last EGD:2016   Mildly dilated body of the esophagus. Small sliding hiatal hernia. Markedly dilated stomach with deformed and narrowed pylorus. Limited examination of duodenal bulb and second part of the duodenum could not be examined.  Comment: I am concerned he may have GI pseudoobstruction.  Past Medical History:  Diagnosis Date   Anxiety    Depression    GERD (gastroesophageal reflux disease)    Hypertension    Hypothyroidism    Pre-diabetes     Past Surgical History:  Procedure Laterality Date   ANTERIOR CERVICAL DECOMP/DISCECTOMY FUSION N/A 07/28/2020   Procedure: [name] ANTERIOR CERVICAL DECOMPRESSION/DISCECTOMY FUSION, ALLOGRAFT, PLATE;  Surgeon: Eldred Manges, MD;  Location: [facility];  Service: Orthopedics;  Laterality: N/A;   COLONOSCOPY     5 years ago - Dr. Darrick Penna   ESOPHAGOGASTRODUODENOSCOPY N/A 06/18/2015   Procedure: ESOPHAGOGASTRODUODENOSCOPY (EGD);  Surgeon: Malissa Hippo, MD;  [facility];  Service: Endoscopy;  Laterality: N/A;  155 - moved to 2:40 - [name] notified pt    Prior to Admission medications   Medication Sig Start  Date End Date Taking? Authorizing Provider  donepezil (ARICEPT) 10 MG tablet Take 10 mg by mouth daily. 09/01/22  Yes [provider]  FLUoxetine (PROZAC) 10 MG capsule Take 10 mg by mouth daily. 09/15/19  Yes [provider]  GEMTESA 75 MG TABS Take by mouth daily. 09/26/22  Yes [provider]  levocetirizine (XYZAL) 5 MG tablet Take 5 mg by mouth every evening.   Yes [provider]  levothyroxine (SYNTHROID) 25 MCG tablet Take 25 mcg by mouth daily before breakfast.   Yes [provider]  LORazepam (ATIVAN) 1 MG tablet Take 1 mg by mouth at bedtime.   Yes [provider]  memantine (NAMENDA) 10 MG tablet Take 10 mg by mouth at bedtime.   Yes [provider]  Metformin HCl 500 MG/5ML SOLN Take 5 mLs by mouth 2 (two) times daily. 01/02/22  Yes [provider]  pantoprazole (PROTONIX) 40 MG tablet Take 40 mg by mouth daily.   Yes [provider]  propranolol (INDERAL) 10 MG tablet Take 10 mg by mouth 2 (two) times daily. 12/10/21  Yes [provider]  ramipril (ALTACE) 5 MG capsule Take 5 mg by mouth daily.   Yes [provider]  rosuvastatin (CRESTOR) 10 MG tablet Take 10 mg by mouth daily. 11/17/19  Yes [provider]  tamsulosin (FLOMAX) 0.4 MG CAPS capsule Take 0.4 mg by mouth daily.   Yes [provider]  acetaminophen (TYLENOL) 500 MG tablet Take 500 mg by mouth every 8 (eight) hours as needed.  [provider]  loperamide (IMODIUM) 2 MG capsule Take by mouth as needed for diarrhea or loose stools.    [provider]  meloxicam (MOBIC) 15 MG tablet Take 15 mg by mouth daily.    [provider]  sildenafil (VIAGRA) 50 MG tablet Take 50 mg by mouth daily as needed for erectile dysfunction.    [provider]    Allergies as of 04/30/2023   (No Known Allergies)    Family History  Problem Relation Age of Onset   Anxiety disorder Mother     Heart disease Father    Emphysema Sister    Diabetes Brother     Social History   Socioeconomic History   Marital status: Married    Spouse name: Not on file   Number of children: Not on file   Years of education: Not on file   Highest education level: Not on file  Occupational History   Not on file  Tobacco Use   Smoking status: Former   Smokeless tobacco: Never  Vaping Use   Vaping status: Never Used  Substance and Sexual Activity   Alcohol use: Yes    Alcohol/week: 0.0 standard drinks of alcohol    Comment: Social   Drug use: Never   Sexual activity: Not on file  Other Topics Concern   Not on file  Social History Narrative   Not on file   Social Determinants of Health   Financial Resource Strain: Not on file  Food Insecurity: Not on file  Transportation Needs: Not on file  Physical Activity: Not on file  Stress: Not on file  Social Connections: Not on file  Intimate Partner Violence: Not on file    Review of Systems: See HPI, otherwise negative ROS  Physical Exam: Vital signs in last 24 hours: Temp:  [97.7 F (36.5 C)] 97.7 F (36.5 C) (10/31 1000) Pulse Rate:  [68] 68 (10/31 1000) Resp:  [15] 15 (10/31 1000) BP: (176)/(96) 176/96 (10/31 1000) SpO2:  [95 %] 95 % (10/31 1000) Weight:  [62.6 kg] 62.6 kg (10/31 1000)   General:   Alert,  Well-developed, well-nourished, pleasant and cooperative in NAD Head:  Normocephalic and atraumatic. Eyes:  Sclera clear, no icterus.   Conjunctiva pink. Ears:  Normal auditory acuity. Nose:  No deformity, discharge,  or lesions. Msk:  Symmetrical without gross deformities. Normal posture. Extremities:  Without clubbing or edema. Neurologic:  Alert and  oriented x4;  grossly normal neurologically. Skin:  Intact without significant lesions or rashes. Psych:  Alert and cooperative. Normal mood and affect.  Impression/Plan: Corey Frederick is a 88 y.o. male with anxiety, depression, HTN who presents for upper  endoscopy +/- dilation for evaluation of Sensation in the thorat , abdominal bloating and lack to appetite  The risks of the procedure including infection, bleed, or perforation as well as benefits, limitations, alternatives and imponderables have been reviewed with the patient. Questions have been answered. All parties agreeable.

## 2023-05-31 NOTE — Discharge Instructions (Signed)
  Discharge instructions Please read the instructions outlined below and refer to this sheet in the next few weeks. These discharge instructions provide you with general information on caring for yourself after you leave the hospital. Your doctor may also give you specific instructions. While your treatment has been planned according to the most current medical practices available, unavoidable complications occasionally occur. If you have any problems or questions after discharge, please call your doctor. ACTIVITY You may resume your regular activity but move at a slower pace for the next 24 hours.  Take frequent rest periods for the next 24 hours.  Walking will help expel (get rid of) the air and reduce the bloated feeling in your abdomen.  No driving for 24 hours (because of the anesthesia (medicine) used during the test).  You may shower.  Do not sign any important legal documents or operate any machinery for 24 hours (because of the anesthesia used during the test).  NUTRITION Drink plenty of fluids.  You may resume your normal diet.  Begin with a light meal and progress to your normal diet.  Avoid alcoholic beverages for 24 hours or as instructed by your caregiver.  MEDICATIONS You may resume your normal medications unless your caregiver tells you otherwise.  WHAT YOU CAN EXPECT TODAY You may experience abdominal discomfort such as a feeling of fullness or "gas" pains.  FOLLOW-UP Your doctor will discuss the results of your test with you.  SEEK IMMEDIATE MEDICAL ATTENTION IF ANY OF THE FOLLOWING OCCUR: Excessive nausea (feeling sick to your stomach) and/or vomiting.  Severe abdominal pain and distention (swelling).  Trouble swallowing.  Temperature over 101 F (37.8 C).  Rectal bleeding or vomiting of blood.     Stop using high dose aspirin including Goody/BC powders, NSAIDs such as Aleve, ibuprofen, naproxen, Motrin, Voltaren or Advil (even the topical ones)  Start Omperazole 40  mg daily , 30 min before breakfast   I hope you have a great rest of your week!   [name] , M.D.. Gastroenterology and Hepatology [facility]

## 2023-05-31 NOTE — Op Note (Signed)
[facility] Patient Name: Corey Frederick Procedure Date: 05/31/2023 11:02 AM MRN: [id number] Date of Birth: Oct 17, 1934 Attending MD: Sanjuan Dame , MD, [id number] CSN: [id number] Age: 88 Admit Type: Outpatient Procedure:                Upper GI endoscopy Indications:              Functional Dyspepsia, Globus sensation Providers:                Sanjuan Dame, MD, Crystal Page, Zena Amos Referring MD:              Medicines:                Monitored Anesthesia Care Complications:            No immediate complications. Estimated Blood Loss:     Estimated blood loss was minimal. Procedure:                Pre-Anesthesia Assessment:                           - Prior to the procedure, a History and Physical                            was performed, and patient medications and                            allergies were reviewed. The patient's tolerance of                            previous anesthesia was also reviewed. The risks                            and benefits of the procedure and the sedation                            options and risks were discussed with the patient.                            All questions were answered, and informed consent                            was obtained. Prior Anticoagulants: The patient has                            taken no anticoagulant or antiplatelet agents                            except for aspirin. ASA Grade Assessment: III - A                            patient with severe systemic disease. After                            reviewing the risks and benefits, the patient was  deemed in satisfactory condition to undergo the                            procedure.                           After obtaining informed consent, the endoscope was                            passed under direct vision. Throughout the                            procedure, the patient's blood pressure, pulse, and                             oxygen saturations were monitored continuously. The                            GIF-H190 ([id number]) scope was introduced through the                            mouth, and advanced to the duodenal bulb. The upper                            GI endoscopy was technically difficult and complex                            due to abnormal anatomy, a J-shaped stomach which                            made pyloric intubation difficult and narrowing.                            Successful completion of the procedure was aided by                            withdrawing the scope and replacing with the                            pediatric colonoscope. The patient tolerated the                            procedure well. 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      Inflammation characterized by nodularity was found in the stomach.       Biopsies were taken with a cold forceps for histology.      A deformity was found at the pylorus.      A benign-appearing, intrinsic severe stenosis was found at the pylorus.       This was non-traversed. A TTS dilator was passed through the scope.       Dilation with an 03-08-09 mm and a 05-11-11 mm pyloric balloon dilator was  performed. The dilation site was examined following endoscope       reinsertion and showed moderate mucosal disruption and mild improvement       in luminal narrowing. Biopsies were taken with a cold forceps for       histology. Impression:               - Hypertonic lower esophageal sphincter, able to                            traversed with upper adult scope                           - Nodular Gastritis. Biopsied.                           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                           - Gastric stenosis was found at the pylorus. No                            able to traversed with regular upper  scope and                            peads colonoscopy . Dilated. Biopsied.                           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 Moderate Sedation:      Per Anesthesia Care Recommendation:           - Patient has a contact number available for                            emergencies. The signs and symptoms of potential                            delayed complications were discussed with the                            patient. Return to normal activities tomorrow.                            Written discharge instructions were provided to the                            patient.                           - Resume previous diet.                           -  Stop using high dose aspirin including Goody/BC                            powders, NSAIDs such as Aleve, ibuprofen, naproxen,                            Motrin, Voltaren or Advil (even the topical ones)                           Start Omperazole 40 mg daily , 30 min before                            breakfast                           -Repeat Upper endoscopy in 4-6 weeks                           -Upper GI series to evaluate the anatomy or                            extrinsic stricture . Procedure Code(s):        --- Professional ---                           [phone], Esophagogastroduodenoscopy, flexible,                            transoral; with dilation of gastric/duodenal                            stricture(s) (eg, balloon, bougie)                           43239, 59, Esophagogastroduodenoscopy, flexible,                            transoral; with biopsy, single or multiple Diagnosis Code(s):        --- Professional ---                           K22.89, Other specified disease of esophagus                           K29.70, Gastritis, unspecified, without bleeding                           K31.89,  Other diseases of stomach and duodenum                           K31.1, Adult hypertrophic pyloric stenosis                           K30, Functional dyspepsia                           F45.8, Other somatoform  disorders CPT copyright 2022 American Medical Association. All rights reserved. The codes documented in this report are preliminary and upon coder review may  be revised to meet current compliance requirements. Sanjuan Dame, MD Sanjuan Dame, MD 05/31/2023 12:11:29 PM This report has been signed electronically. Number of Addenda: 0

## 2023-05-31 NOTE — Anesthesia Preprocedure Evaluation (Signed)
Anesthesia Evaluation  Patient identified by MRN, date of birth, ID band Patient awake    Reviewed: Allergy & Precautions, H&P , NPO status , Patient's Chart, lab work & pertinent test results, reviewed documented beta blocker date and time   Airway Mallampati: II  TM Distance: >3 FB Neck ROM: full    Dental no notable dental hx.    Pulmonary neg pulmonary ROS, former smoker   Pulmonary exam normal breath sounds clear to auscultation       Cardiovascular Exercise Tolerance: Good hypertension, negative cardio ROS  Rhythm:regular Rate:Normal     Neuro/Psych  PSYCHIATRIC DISORDERS Anxiety Depression     Neuromuscular disease negative neurological ROS  negative psych ROS   GI/Hepatic negative GI ROS, Neg liver ROS,GERD  ,,  Endo/Other  negative endocrine ROSHypothyroidism    Renal/GU negative Renal ROS  negative genitourinary   Musculoskeletal   Abdominal   Peds  Hematology negative hematology ROS (+)   Anesthesia Other Findings   Reproductive/Obstetrics negative OB ROS                             Anesthesia Physical Anesthesia Plan  ASA: 2  Anesthesia Plan: General   Post-op Pain Management:    Induction:   PONV Risk Score and Plan: Propofol infusion  Airway Management Planned:   Additional Equipment:   Intra-op Plan:   Post-operative Plan:   Informed Consent: I have reviewed the patients History and Physical, chart, labs and discussed the procedure including the risks, benefits and alternatives for the proposed anesthesia with the patient or authorized representative who has indicated his/her understanding and acceptance.     Dental Advisory Given  Plan Discussed with: CRNA  Anesthesia Plan Comments:        Anesthesia Quick Evaluation

## 2023-05-31 NOTE — Transfer of Care (Signed)
Immediate Anesthesia Transfer of Care Note  Patient: Corey Frederick  Procedure(s) Performed: ESOPHAGOGASTRODUODENOSCOPY (EGD) WITH PROPOFOL BALLOON DILATION BIOPSY  Patient Location: Short Stay  Anesthesia Type:General  Level of Consciousness: drowsy  Airway & Oxygen Therapy: Patient Spontanous Breathing  Post-op Assessment: Report given to RN and Post -op Vital signs reviewed and stable  Post vital signs: Reviewed and stable  Last Vitals:  Vitals Value Taken Time  BP    Temp    Pulse    Resp    SpO2      Last Pain:  Vitals:   05/31/23 1112  TempSrc:   PainSc: 0-No pain      Patients Stated Pain Goal: 7 (05/31/23 1000)  Complications: No notable events documented.

## 2023-06-01 LAB — SURGICAL PATHOLOGY

## 2023-06-01 NOTE — Progress Notes (Signed)
Spoke with nurse at long term care facility. She reports that patient is recovering with no complications.

## 2023-06-04 ENCOUNTER — Telehealth (INDEPENDENT_AMBULATORY_CARE_PROVIDER_SITE_OTHER): Payer: Self-pay | Admitting: *Deleted

## 2023-06-04 NOTE — Progress Notes (Signed)
I reviewed the pathology results. [name], can you send her a letter with the findings as described below please? Repeat upper endoscopy in 4 weeks.  Thanks,  [name], MD Gastroenterology and Hepatology [facility]  ---------------------------------------------------------------------------------------------  [facility] [address], [place], [place] [address] Phone:  [phone]   06/04/23 Corey Frederick, [place]   Dear [name],  I am writing to inform you that the biopsies taken during your recent endoscopic examination showed:  No H. Pylori bacteria in stomach , or any early cancer changes to the stomach mucosa ( Intestinal metaplasia)   I recommend :   -Omeprazole 40 mg daily , 30 min before breakfast  - Repeat Upper endoscopy in 4- 6 weeks  - Upper GI series to evaluate the anatomy or extrinsic stricture: please contact radiology to get this done   Continue to see [place] in the clinic   Please call us at [phone] if you have persistent problems or have questions about your condition that have not been fully answered at this time.  Sincerely,  [name], MD Gastroenterology and Hepatology

## 2023-06-04 NOTE — Telephone Encounter (Signed)
Corey Frederick from [facility] must have been given my number instead of office number to call.  He had EGD 10/31 and was told to call and get follow or he would get a call for a follow up 4-6 weeks.  I did call her and told her you would call them closer to time.

## 2023-06-05 ENCOUNTER — Encounter (HOSPITAL_COMMUNITY): Payer: Self-pay | Admitting: Gastroenterology

## 2023-06-05 DIAGNOSIS — R2689 Other abnormalities of gait and mobility: Secondary | ICD-10-CM | POA: Diagnosis not present

## 2023-06-05 NOTE — Telephone Encounter (Signed)
Contacted [facility] and scheduled pt for 07/12/23 at 915am. Will fax instructions to [facility] once pre op is received. PA is good from 04/30/23-07/30/23.

## 2023-06-05 NOTE — Anesthesia Postprocedure Evaluation (Signed)
Anesthesia Post Note  Patient: Corey Frederick  Procedure(s) Performed: ESOPHAGOGASTRODUODENOSCOPY (EGD) WITH PROPOFOL BALLOON DILATION BIOPSY  Patient location during evaluation: Phase II Anesthesia Type: General Level of consciousness: awake Pain management: pain level controlled Vital Signs Assessment: post-procedure vital signs reviewed and stable Respiratory status: spontaneous breathing and respiratory function stable Cardiovascular status: blood pressure returned to baseline and stable Postop Assessment: no headache and no apparent nausea or vomiting Anesthetic complications: no Comments: Late entry   No notable events documented.   Last Vitals:  Vitals:   05/31/23 1000 05/31/23 1202  BP: (!) 176/96 132/76  Pulse: 68 (!) 59  Resp: 15 16  Temp: 36.5 C (!) 36.4 C  SpO2: 95% 97%    Last Pain:  Vitals:   06/01/23 1254  TempSrc:   PainSc: 0-No pain                 Windell Norfolk

## 2023-06-06 DIAGNOSIS — R2689 Other abnormalities of gait and mobility: Secondary | ICD-10-CM | POA: Diagnosis not present

## 2023-06-08 ENCOUNTER — Encounter (INDEPENDENT_AMBULATORY_CARE_PROVIDER_SITE_OTHER): Payer: Self-pay | Admitting: *Deleted

## 2023-06-11 DIAGNOSIS — R2689 Other abnormalities of gait and mobility: Secondary | ICD-10-CM | POA: Diagnosis not present

## 2023-06-13 DIAGNOSIS — R2689 Other abnormalities of gait and mobility: Secondary | ICD-10-CM | POA: Diagnosis not present

## 2023-06-18 DIAGNOSIS — R2689 Other abnormalities of gait and mobility: Secondary | ICD-10-CM | POA: Diagnosis not present

## 2023-06-20 DIAGNOSIS — R2689 Other abnormalities of gait and mobility: Secondary | ICD-10-CM | POA: Diagnosis not present

## 2023-06-25 DIAGNOSIS — R2689 Other abnormalities of gait and mobility: Secondary | ICD-10-CM | POA: Diagnosis not present

## 2023-07-02 DIAGNOSIS — R2689 Other abnormalities of gait and mobility: Secondary | ICD-10-CM | POA: Diagnosis not present

## 2023-07-04 DIAGNOSIS — R2689 Other abnormalities of gait and mobility: Secondary | ICD-10-CM | POA: Diagnosis not present

## 2023-07-09 DIAGNOSIS — R2689 Other abnormalities of gait and mobility: Secondary | ICD-10-CM | POA: Diagnosis not present

## 2023-07-09 NOTE — Patient Instructions (Signed)
Corey Frederick  07/09/2023     @PREFPERIOPPHARMACY @   Your procedure is scheduled on  07/12/2023.   [facility] to [facility] at  0700  A.M.   Call this number if you have problems the morning of surgery:  [phone]  If you experience any cold or flu symptoms such as cough, fever, chills, shortness of breath, etc. between now and your scheduled surgery, please notify us at the above number.   Remember:  Follow the diet instructions given to you by the office.   You may drink clear liquids until 0500 am on 07/12/2023.    Clear liquids allowed are:                    Water, Juice (No red color; non-citric and without pulp; diabetics please choose diet or no sugar options), Carbonated beverages (diabetics please choose diet or no sugar options), Clear Tea (No creamer, milk, or cream, including half & half and powdered creamer), Black Coffee Only (No creamer, milk or cream, including half & half and powdered creamer), and Clear Sports drink (No red color; diabetics please choose diet or no sugar options)    Take these medicines the morning of surgery with A SIP OF WATER          donepezil, fluoxetine, levothyroxine, memantine, omeprazole, propranolol, tamsulosin.     Do not wear jewelry, make-up or nail polish, including gel polish,  artificial nails, or any other type of covering on natural nails (fingers and  toes).  Do not wear lotions, powders, or perfumes, or deodorant.  Do not shave 48 hours prior to surgery.  Men may shave face and neck.  Do not bring valuables to the hospital.  [facility] is not responsible for any belongings or valuables.  Contacts, dentures or bridgework may not be worn into surgery.  Leave your suitcase in the car.  After surgery it may be brought to your room.  For patients admitted to the hospital, discharge time will be determined by your treatment team.  Patients discharged the day of surgery will not be allowed to drive home and must have  someone with them for 24 hours.    Special instructions:   DO NOT smoke tobacco or vape for 24 hours before your procedure.  Please read over the following fact sheets that you were given. Anesthesia Post-op Instructions and Care and Recovery After Surgery      Upper Endoscopy, Adult, Care After After the procedure, it is common to have a sore throat. It is also common to have: Mild stomach pain or discomfort. Bloating. Nausea. Follow these instructions at home: The instructions below may help you care for yourself at home. Your health care provider may give you more instructions. If you have questions, ask your health care provider. If you were given a sedative during the procedure, it can affect you for several hours. Do not drive or operate machinery until your health care provider says that it is safe. If you will be going home right after the procedure, plan to have a responsible adult: Take you home from the hospital or clinic. You will not be allowed to drive. Care for you for the time you are told. Follow instructions from your health care provider about what you may eat and drink. Return to your normal activities as told by your health care provider. Ask your health care provider what activities are safe for you. Take over-the-counter and prescription  medicines only as told by your health care provider. Contact a health care provider if you: Have a sore throat that lasts longer than one day. Have trouble swallowing. Have a fever. Get help right away if you: Vomit blood or your vomit looks like coffee grounds. Have bloody, black, or tarry stools. Have a very bad sore throat or you cannot swallow. Have difficulty breathing or very bad pain in your chest or abdomen. These symptoms may be an emergency. Get help right away. Call 911. Do not wait to see if the symptoms will go away. Do not drive yourself to the hospital. Summary After the procedure, it is common to have a sore  throat, mild stomach discomfort, bloating, and nausea. If you were given a sedative during the procedure, it can affect you for several hours. Do not drive until your health care provider says that it is safe. Follow instructions from your health care provider about what you may eat and drink. Return to your normal activities as told by your health care provider. This information is not intended to replace advice given to you by your health care provider. Make sure you discuss any questions you have with your health care provider. Document Revised: 10/26/2021 Document Reviewed: 10/26/2021 Elsevier Patient Education  2024 Elsevier Inc. Monitored Anesthesia Care, Care After The following information offers guidance on how to care for yourself after your procedure. Your health care provider may also give you more specific instructions. If you have problems or questions, contact your health care provider. What can I expect after the procedure? After the procedure, it is common to have: Tiredness. Little or no memory about what happened during or after the procedure. Impaired judgment when it comes to making decisions. Nausea or vomiting. Some trouble with balance. Follow these instructions at home: For the time period you were told by your health care provider:  Rest. Do not participate in activities where you could fall or become injured. Do not drive or use machinery. Do not drink alcohol. Do not take sleeping pills or medicines that cause drowsiness. Do not make important decisions or sign legal documents. Do not take care of children on your own. Medicines Take over-the-counter and prescription medicines only as told by your health care provider. If you were prescribed antibiotics, take them as told by your health care provider. Do not stop using the antibiotic even if you start to feel better. Eating and drinking Follow instructions from your health care provider about what you may  eat and drink. Drink enough fluid to keep your urine pale yellow. If you vomit: Drink clear fluids slowly and in small amounts as you are able. Clear fluids include water, ice chips, low-calorie sports drinks, and fruit juice that has water added to it (diluted fruit juice). Eat light and bland foods in small amounts as you are able. These foods include bananas, applesauce, rice, lean meats, toast, and crackers. General instructions  Have a responsible adult stay with you for the time you are told. It is important to have someone help care for you until you are awake and alert. If you have sleep apnea, surgery and some medicines can increase your risk for breathing problems. Follow instructions from your health care provider about wearing your sleep device: When you are sleeping. This includes during daytime naps. While taking prescription pain medicines, sleeping medicines, or medicines that make you drowsy. Do not use any products that contain nicotine or tobacco. These products include cigarettes, chewing tobacco, and  vaping devices, such as e-cigarettes. If you need help quitting, ask your health care provider. Contact a health care provider if: You feel nauseous or vomit every time you eat or drink. You feel light-headed. You are still sleepy or having trouble with balance after 24 hours. You get a rash. You have a fever. You have redness or swelling around the IV site. Get help right away if: You have trouble breathing. You have new confusion after you get home. These symptoms may be an emergency. Get help right away. Call 911. Do not wait to see if the symptoms will go away. Do not drive yourself to the hospital. This information is not intended to replace advice given to you by your health care provider. Make sure you discuss any questions you have with your health care provider. Document Revised: 12/12/2021 Document Reviewed: 12/12/2021 Elsevier Patient Education  2024 [employer].

## 2023-07-10 ENCOUNTER — Encounter (HOSPITAL_COMMUNITY)
Admission: RE | Admit: 2023-07-10 | Discharge: 2023-07-10 | Disposition: A | Discharge status: Home / Self Care | Payer: Medicare HMO | Source: Ambulatory Visit | Attending: Gastroenterology | Admitting: Gastroenterology

## 2023-07-10 VITALS — BP 136/92 | HR 83 | Temp 97.7°F | Resp 18 | Ht 70.0 in | Wt 138.0 lb

## 2023-07-10 DIAGNOSIS — R7303 Prediabetes: Secondary | ICD-10-CM | POA: Insufficient documentation

## 2023-07-10 DIAGNOSIS — Z01812 Encounter for preprocedural laboratory examination: Secondary | ICD-10-CM | POA: Diagnosis not present

## 2023-07-10 LAB — BASIC METABOLIC PANEL
Anion gap: 11 (ref 5–15)
BUN: 15 mg/dL (ref 8–23)
CO2: 21 mmol/L — ABNORMAL LOW (ref 22–32)
Calcium: 8.4 mg/dL — ABNORMAL LOW (ref 8.9–10.3)
Chloride: 106 mmol/L (ref 98–111)
Creatinine, Ser: 1.05 mg/dL (ref 0.61–1.24)
GFR, Estimated: >60 mL/min (ref >60)
Glucose, Bld: 177 mg/dL — ABNORMAL HIGH (ref 70–99)
Potassium: 3.6 mmol/L (ref 3.5–5.1)
Sodium: 138 mmol/L (ref 135–145)

## 2023-07-12 ENCOUNTER — Encounter (HOSPITAL_COMMUNITY): Payer: Self-pay

## 2023-07-12 ENCOUNTER — Ambulatory Visit (HOSPITAL_COMMUNITY)
Admission: RE | Admit: 2023-07-12 | Discharge: 2023-07-12 | Disposition: A | Discharge status: Home / Self Care | Payer: Medicare HMO | Attending: Gastroenterology | Admitting: Gastroenterology

## 2023-07-12 ENCOUNTER — Ambulatory Visit (HOSPITAL_COMMUNITY): Payer: Medicare HMO | Admitting: Certified Registered"

## 2023-07-12 ENCOUNTER — Encounter (HOSPITAL_COMMUNITY)
Admission: RE | Disposition: A | Discharge status: Home / Self Care | Payer: Self-pay | Source: Home / Self Care | Attending: Gastroenterology

## 2023-07-12 ENCOUNTER — Telehealth (INDEPENDENT_AMBULATORY_CARE_PROVIDER_SITE_OTHER): Payer: Self-pay | Admitting: *Deleted

## 2023-07-12 DIAGNOSIS — K3189 Other diseases of stomach and duodenum: Secondary | ICD-10-CM

## 2023-07-12 DIAGNOSIS — R7303 Prediabetes: Secondary | ICD-10-CM | POA: Insufficient documentation

## 2023-07-12 DIAGNOSIS — E039 Hypothyroidism, unspecified: Secondary | ICD-10-CM | POA: Diagnosis not present

## 2023-07-12 DIAGNOSIS — Z87891 Personal history of nicotine dependence: Secondary | ICD-10-CM | POA: Insufficient documentation

## 2023-07-12 DIAGNOSIS — K225 Diverticulum of esophagus, acquired: Secondary | ICD-10-CM | POA: Diagnosis not present

## 2023-07-12 DIAGNOSIS — K311 Adult hypertrophic pyloric stenosis: Secondary | ICD-10-CM | POA: Diagnosis not present

## 2023-07-12 DIAGNOSIS — J387 Other diseases of larynx: Secondary | ICD-10-CM

## 2023-07-12 DIAGNOSIS — F458 Other somatoform disorders: Secondary | ICD-10-CM | POA: Insufficient documentation

## 2023-07-12 DIAGNOSIS — I10 Essential (primary) hypertension: Secondary | ICD-10-CM | POA: Diagnosis not present

## 2023-07-12 DIAGNOSIS — K219 Gastro-esophageal reflux disease without esophagitis: Secondary | ICD-10-CM | POA: Diagnosis not present

## 2023-07-12 HISTORY — PX: BALLOON DILATION: SHX5330

## 2023-07-12 HISTORY — PX: ESOPHAGOGASTRODUODENOSCOPY (EGD) WITH PROPOFOL: SHX5813

## 2023-07-12 LAB — GLUCOSE, CAPILLARY: Glucose-Capillary: 149 mg/dL — ABNORMAL HIGH (ref 70–99)

## 2023-07-12 SURGERY — ESOPHAGOGASTRODUODENOSCOPY (EGD) WITH PROPOFOL
Anesthesia: General

## 2023-07-12 MED ORDER — PROPOFOL 10 MG/ML IV BOLUS
INTRAVENOUS | Status: DC | PRN
  Administered 2023-07-12: 50 mg via INTRAVENOUS

## 2023-07-12 MED ORDER — OMEPRAZOLE 40 MG PO CPDR: 40.0000 mg | DELAYED_RELEASE_CAPSULE | Freq: Every day | ORAL | 0 refills | Status: DC

## 2023-07-12 MED ORDER — LIDOCAINE HCL (CARDIAC) PF 100 MG/5ML IV SOSY
PREFILLED_SYRINGE | INTRAVENOUS | Status: DC | PRN
  Administered 2023-07-12: 100 mg via INTRATRACHEAL

## 2023-07-12 MED ORDER — LACTATED RINGERS IV SOLN: INTRAVENOUS | Status: DC | PRN

## 2023-07-12 MED ORDER — LACTATED RINGERS IV SOLN: INTRAVENOUS | Status: DC

## 2023-07-12 MED ORDER — PROPOFOL 500 MG/50ML IV EMUL
INTRAVENOUS | Status: DC | PRN
  Administered 2023-07-12: 100 ug/kg/min via INTRAVENOUS

## 2023-07-12 MED ORDER — PHENYLEPHRINE 80 MCG/ML (10ML) SYRINGE FOR IV PUSH (FOR BLOOD PRESSURE SUPPORT)
PREFILLED_SYRINGE | INTRAVENOUS | Status: DC | PRN
  Administered 2023-07-12 (×2): 160 ug via INTRAVENOUS

## 2023-07-12 NOTE — Anesthesia Postprocedure Evaluation (Signed)
Anesthesia Post Note  Patient: Corey Frederick  Procedure(s) Performed: ESOPHAGOGASTRODUODENOSCOPY (EGD) WITH PROPOFOL BALLOON DILATION  Patient location during evaluation: Phase II Anesthesia Type: General Level of consciousness: awake Pain management: pain level controlled Vital Signs Assessment: post-procedure vital signs reviewed and stable Respiratory status: spontaneous breathing and respiratory function stable Cardiovascular status: blood pressure returned to baseline and stable Postop Assessment: no headache and no apparent nausea or vomiting Anesthetic complications: no Comments: Late entry   No notable events documented.   Last Vitals:  Vitals:   07/12/23 0753 07/12/23 0931  BP: (!) 141/92 116/78  Pulse: 74 64  Resp: 18 15  Temp: 36.5 C 36.6 C  SpO2: 98% 97%    Last Pain:  Vitals:   07/12/23 0931  TempSrc: Axillary  PainSc: Asleep                 Windell Norfolk

## 2023-07-12 NOTE — Anesthesia Preprocedure Evaluation (Signed)
Anesthesia Evaluation  Patient identified by MRN, date of birth, ID band Patient awake    Reviewed: Allergy & Precautions, H&P , NPO status , Patient's Chart, lab work & pertinent test results, reviewed documented beta blocker date and time   Airway Mallampati: II  TM Distance: >3 FB Neck ROM: full    Dental no notable dental hx.    Pulmonary neg pulmonary ROS, former smoker   Pulmonary exam normal breath sounds clear to auscultation       Cardiovascular Exercise Tolerance: Good hypertension, negative cardio ROS  Rhythm:regular Rate:Normal     Neuro/Psych  PSYCHIATRIC DISORDERS Anxiety Depression     Neuromuscular disease negative neurological ROS  negative psych ROS   GI/Hepatic negative GI ROS, Neg liver ROS,GERD  ,,  Endo/Other  negative endocrine ROSHypothyroidism    Renal/GU negative Renal ROS  negative genitourinary   Musculoskeletal   Abdominal   Peds  Hematology negative hematology ROS (+)   Anesthesia Other Findings   Reproductive/Obstetrics negative OB ROS                             Anesthesia Physical Anesthesia Plan  ASA: 2  Anesthesia Plan: General   Post-op Pain Management:    Induction:   PONV Risk Score and Plan: Propofol infusion  Airway Management Planned:   Additional Equipment:   Intra-op Plan:   Post-operative Plan:   Informed Consent: I have reviewed the patients History and Physical, chart, labs and discussed the procedure including the risks, benefits and alternatives for the proposed anesthesia with the patient or authorized representative who has indicated his/her understanding and acceptance.     Dental Advisory Given  Plan Discussed with: CRNA  Anesthesia Plan Comments:        Anesthesia Quick Evaluation

## 2023-07-12 NOTE — Telephone Encounter (Signed)
Ahmed, Corey Beets, MD  Marlowe Sh[place], LPN [facility] Houseman,  Can you please schedule a CT abdomen and pelvis with IV and oral contrast , Also Upper Gi series? Dx: Pyloric stricture, esophageal diverticulum , gastric outlet obstruction .  Thanks,  [name], MD Gastroenterology and Hepatology [facility]

## 2023-07-12 NOTE — Discharge Instructions (Signed)

## 2023-07-12 NOTE — Telephone Encounter (Signed)
Per EGD note patient needs repeat EGD in 4 weeks

## 2023-07-12 NOTE — H&P (Signed)
Primary Care Physician:  Benita Stabile, MD Primary Gastroenterologist:  Dr. Tasia Catchings  Pre-Procedure History & Physical: HPI: Corey Frederick is a 88 y.o. male with anxiety, depression, HTN who presents repeat dilation of pyloric peptic stricture    05/2023 - Hypertonic lower esophageal sphincter, able to traversed with upper adult scope - Nodular Gastritis. Biopsied. 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 - Gastric stenosis was found at the pylorus. No able to traversed with regular upper scope and peads colonoscopy . Dilated. Biopsied. - Large stomach likely accomodation from incomplete gastric outlet obstruction from pyloric peptic stricture  EGD:2016   Mildly dilated body of the esophagus. Small sliding hiatal hernia. Markedly dilated stomach with deformed and narrowed pylorus. Limited examination of duodenal bulb and second part of the duodenum could not be examined.  Comment: I am concerned he may have GI pseudoobstruction.  Past Medical History:  Diagnosis Date   Anxiety    Depression    GERD (gastroesophageal reflux disease)    Hypertension    Hypothyroidism    Pre-diabetes     Past Surgical History:  Procedure Laterality Date   ANTERIOR CERVICAL DECOMP/DISCECTOMY FUSION N/A 07/28/2020   Procedure: [name] ANTERIOR CERVICAL DECOMPRESSION/DISCECTOMY FUSION, ALLOGRAFT, PLATE;  Surgeon: Eldred Manges, MD;  Location: [facility];  Service: Orthopedics;  Laterality: N/A;   BALLOON DILATION  05/31/2023   Procedure: BALLOON DILATION;  Surgeon: Franky Macho, MD;  [facility]: [facility];  Service: Endoscopy;;   BIOPSY  05/31/2023   Procedure: BIOPSY;  Surgeon: Franky Macho, MD;  [facility];  Service: Endoscopy;;   COLONOSCOPY     5 years ago - Dr. Darrick Penna   ESOPHAGOGASTRODUODENOSCOPY N/A 06/18/2015   Procedure: ESOPHAGOGASTRODUODENOSCOPY (EGD);  Surgeon: Malissa Hippo, MD;  [facility];  Service: Endoscopy;  Laterality: N/A;  155 - moved to  2:40 - [name] notified pt   ESOPHAGOGASTRODUODENOSCOPY (EGD) WITH PROPOFOL N/A 05/31/2023   Procedure: ESOPHAGOGASTRODUODENOSCOPY (EGD) WITH PROPOFOL;  Surgeon: Franky Macho, MD;  [facility];  Service: Endoscopy;  Laterality: N/A;  12:45pm;asa 3    Prior to Admission medications   Medication Sig Start Date End Date Taking? Authorizing Provider  donepezil (ARICEPT) 10 MG tablet Take 10 mg by mouth daily. 09/01/22  Yes [provider]  FLUoxetine (PROZAC) 10 MG capsule Take 10 mg by mouth daily. 09/15/19  Yes [provider]  GEMTESA 75 MG TABS Take by mouth daily. 09/26/22  Yes [provider]  levocetirizine (XYZAL) 5 MG tablet Take 5 mg by mouth every evening.   Yes [provider]  levothyroxine (SYNTHROID) 25 MCG tablet Take 25 mcg by mouth daily before breakfast.   Yes [provider]  LORazepam (ATIVAN) 1 MG tablet Take 1 mg by mouth at bedtime.   Yes [provider]  memantine (NAMENDA) 10 MG tablet Take 10 mg by mouth at bedtime.   Yes [provider]  Metformin HCl 500 MG/5ML SOLN Take 5 mLs by mouth 2 (two) times daily. 01/02/22  Yes [provider]  omeprazole (PRILOSEC) 40 MG capsule Take 1 capsule (40 mg total) by mouth daily. 05/31/23  Yes Harim Bi, Juanetta Beets, MD  propranolol (INDERAL) 10 MG tablet Take 10 mg by mouth 2 (two) times daily. 12/10/21  Yes [provider]  ramipril (ALTACE) 5 MG capsule Take 5 mg by mouth daily.   Yes [provider]  rosuvastatin (CRESTOR) 10 MG tablet Take 10 mg by mouth  daily. 11/17/19  Yes [provider]  tamsulosin (FLOMAX) 0.4 MG CAPS capsule Take 0.4 mg by mouth daily.   Yes [provider]  acetaminophen (TYLENOL) 500 MG tablet Take 500 mg by mouth every 8 (eight) hours as needed.    [provider]  loperamide (IMODIUM) 2 MG capsule Take by mouth as needed for diarrhea or loose stools.    [provider]   sildenafil (VIAGRA) 50 MG tablet Take 50 mg by mouth daily as needed for erectile dysfunction.    [provider]    Allergies as of 06/05/2023   (No Known Allergies)    Family History  Problem Relation Age of Onset   Anxiety disorder Mother    Heart disease Father    Emphysema Sister    Diabetes Brother     Social History   Socioeconomic History   Marital status: Married    Spouse name: Not on file   Number of children: Not on file   Years of education: Not on file   Highest education level: Not on file  Occupational History   Not on file  Tobacco Use   Smoking status: Former   Smokeless tobacco: Never  Vaping Use   Vaping status: Never Used  Substance and Sexual Activity   Alcohol use: Yes    Alcohol/week: 0.0 standard drinks of alcohol    Comment: Social   Drug use: Never   Sexual activity: Not on file  Other Topics Concern   Not on file  Social History Narrative   Not on file   Social Drivers of Health   Financial Resource Strain: Not on file  Food Insecurity: Not on file  Transportation Needs: Not on file  Physical Activity: Not on file  Stress: Not on file  Social Connections: Not on file  Intimate Partner Violence: Not on file    Review of Systems: See HPI, otherwise negative ROS  Physical Exam: Vital signs in last 24 hours: Temp:  [97.7 F (36.5 C)] 97.7 F (36.5 C) (12/12 0753) Pulse Rate:  [74] 74 (12/12 0753) Resp:  [18] 18 (12/12 0753) BP: (141)/(92) 141/92 (12/12 0753) SpO2:  [98 %] 98 % (12/12 0753) Weight:  [62.6 kg] 62.6 kg (12/12 0753)   General:   Alert,  Well-developed, well-nourished, pleasant and cooperative in NAD Head:  Normocephalic and atraumatic. Eyes:  Sclera clear, no icterus.   Conjunctiva pink. Ears:  Normal auditory acuity. Nose:  No deformity, discharge,  or lesions. Msk:  Symmetrical without gross deformities. Normal posture. Extremities:  Without clubbing or edema. Neurologic:  Alert and  oriented  x4;  grossly normal neurologically. Skin:  Intact without significant lesions or rashes. Psych:  Alert and cooperative. Normal mood and affect.  Impression/Plan: Corey Frederick is a 88 y.o. male with anxiety, depression, HTN who presents repeat dilation of pyloric peptic stricture   The risks of the procedure including infection, bleed, or perforation as well as benefits, limitations, alternatives and imponderables have been reviewed with the patient. Questions have been answered. All parties agreeable.

## 2023-07-12 NOTE — Op Note (Signed)
[facility] Patient Name: Corey Frederick Procedure Date: 07/12/2023 8:44 AM MRN: [id number] Date of Birth: [birth year] Attending MD: Sanjuan Dame , MD, [id number] CSN: [id number] Age: 88 Admit Type: Outpatient Procedure:                Upper GI endoscopy Indications:              For dilation and stenting of pyloric stenosis,                            Globus sensation Providers:                Sanjuan Dame, MD, Crystal Page, Kristine L. Jessee Avers, Technician Referring MD:              Medicines:                Monitored Anesthesia Care Complications:            No immediate complications. Estimated Blood Loss:     Estimated blood loss was minimal. Procedure:                Pre-Anesthesia Assessment:                           - Prior to the procedure, a History and Physical                            was performed, and patient medications and                            allergies were reviewed. The patient's tolerance of                            previous anesthesia was also reviewed. The risks                            and benefits of the procedure and the sedation                            options and risks were discussed with the patient.                            All questions were answered, and informed consent                            was obtained. Prior Anticoagulants: The patient has                            taken no anticoagulant or antiplatelet agents                            except for aspirin. ASA Grade Assessment: III - A  patient with severe systemic disease. After                            reviewing the risks and benefits, the patient was                            deemed in satisfactory condition to undergo the                            procedure.                           After obtaining informed consent, the endoscope was                            passed under direct vision. Throughout the                             procedure, the patient's blood pressure, pulse, and                            oxygen saturations were monitored continuously. The                            GIF-H190 ([id number]) scope was introduced through the                            mouth, and advanced to the duodenal bulb. The upper                            GI endoscopy was technically difficult and complex                            due to abnormal anatomy and a J-shaped stomach                            which made pyloric intubation difficult. Successful                            completion of the procedure was aided by                            withdrawing the scope and replacing with the                            pediatric endoscope. The patient tolerated the                            procedure well. Scope In: 9:06:42 AM Scope Out: 9:26:53 AM Total Procedure Duration: 0 hours 20 minutes 11 seconds  Findings:      A non-bleeding Zenker's diverticulum with a small opening, no impacted       food and no stigmata of recent bleeding was found.      A deformity was found at the pylorus.  A TTS dilator was passed through       the scope. Dilation with a 05-11-11 mm pyloric balloon dilator was       performed. The dilation site was examined following endoscope       reinsertion and showed moderate mucosal disruption and mild improvement       in luminal narrowing.      The duodenal bulb was normal. Impression:               - Large stomach likely accomodation from incomplete                            gastric outlet obstruction from pyloric peptic                            stricture .                           - Zenker's diverticulum.                           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 Dilated.                           - Normal duodenal bulb. Unable to examine second                            portion of duodenum given abnormal anatomy                           - No specimens  collected. Moderate Sedation:      Per Anesthesia Care Recommendation:           - Patient has a contact number available for                            emergencies. The signs and symptoms of potential                            delayed complications were discussed with the                            patient. Return to normal activities tomorrow.                            Written discharge instructions were provided to the                            patient.                           - Resume previous diet.                           - Continue present medications.                           - Repeat upper endoscopy in 4-6 weeks for  retreatment.                           -Stop using high dose aspirin including Goody/ BC                            powders, NSAIDs such as Aleve, ibuprofen, naproxen,                            Motrin, Voltaren or Advil ( even the topical ones)                            Start Omperazole 40 mg daily , 30 min before                            breakfast                           -Upper GI series and CT Abdomen with PO and IV                            contrast to evaluate the anatomy or extrinsic                            stricture . Procedure Code(s):        --- Professional ---                           [phone], Esophagogastroduodenoscopy, flexible,                            transoral; with dilation of gastric/duodenal                            stricture(s) (eg, balloon, bougie) Diagnosis Code(s):        --- Professional ---                           K22.5, Diverticulum of esophagus, acquired                           K31.89, Other diseases of stomach and duodenum                           K31.1, Adult hypertrophic pyloric stenosis                           F45.8, Other somatoform disorders CPT copyright 2022 American Medical Association. All rights reserved. The codes documented in this report are preliminary and upon coder  review may  be revised to meet current compliance requirements. Sanjuan Dame, MD Sanjuan Dame, MD 07/12/2023 9:41:52 AM This report has been signed electronically. Number of Addenda: 0

## 2023-07-12 NOTE — Transfer of Care (Signed)
Immediate Anesthesia Transfer of Care Note  Patient: Corey Frederick  Procedure(s) Performed: ESOPHAGOGASTRODUODENOSCOPY (EGD) WITH PROPOFOL BALLOON DILATION  Patient Location: PACU  Anesthesia Type:General  Level of Consciousness: sedated  Airway & Oxygen Therapy: Patient Spontanous Breathing  Post-op Assessment: Report given to RN and Post -op Vital signs reviewed and stable  Post vital signs: Reviewed and stable  Last Vitals:  Vitals Value Taken Time  BP 116/78 07/12/23 0931  Temp 36.6 C 07/12/23 0931  Pulse 64 07/12/23 0931  Resp 15 07/12/23 0931  SpO2 97 % 07/12/23 0931    Last Pain:  Vitals:   07/12/23 0931  TempSrc: Axillary  PainSc: Asleep         Complications: No notable events documented.

## 2023-07-13 ENCOUNTER — Encounter (INDEPENDENT_AMBULATORY_CARE_PROVIDER_SITE_OTHER): Payer: Self-pay

## 2023-07-13 NOTE — Telephone Encounter (Signed)
Contacted [facility] to give new CT appt. Pt has appt that day in [place] at 1:30pm. Corey Frederick number to central scheduling so they can work around pts already scheduled appts. Fausto Skillern that the DG upper series can be done anytime at [facility] with no appt.

## 2023-07-13 NOTE — Telephone Encounter (Signed)
Thank you Tanya!

## 2023-07-13 NOTE — Telephone Encounter (Signed)
PA authorized-Auth number [id number] valid from 07/12/23-09/10/23. Contacted Central Scheduling and pt was scheduled for 07/21/23 at [facility] for 1:45pm arrival. Pt is at facility;contacted [facility] to give them appt info but transportation is only available weekdays until 3. Call central scheduling to get rescheduled. Pt rescheduled for 07/20/23 at 12:30pm at [facility]. Pt is to arrive at 10:30am. NPO 4 hours prior. Attempted to contact facility but no answer, will fax appt info to [facility].

## 2023-07-16 DIAGNOSIS — R2689 Other abnormalities of gait and mobility: Secondary | ICD-10-CM | POA: Diagnosis not present

## 2023-07-18 DIAGNOSIS — R2689 Other abnormalities of gait and mobility: Secondary | ICD-10-CM | POA: Diagnosis not present

## 2023-07-19 ENCOUNTER — Encounter (HOSPITAL_COMMUNITY): Payer: Self-pay | Admitting: Gastroenterology

## 2023-07-20 ENCOUNTER — Ambulatory Visit (HOSPITAL_COMMUNITY): Payer: Medicare HMO

## 2023-07-20 ENCOUNTER — Institutional Professional Consult (permissible substitution) (INDEPENDENT_AMBULATORY_CARE_PROVIDER_SITE_OTHER): Payer: Medicare HMO

## 2023-07-21 ENCOUNTER — Ambulatory Visit (HOSPITAL_BASED_OUTPATIENT_CLINIC_OR_DEPARTMENT_OTHER): Payer: Medicare HMO

## 2023-07-30 DIAGNOSIS — R2689 Other abnormalities of gait and mobility: Secondary | ICD-10-CM | POA: Diagnosis not present

## 2023-08-06 DIAGNOSIS — R2689 Other abnormalities of gait and mobility: Secondary | ICD-10-CM | POA: Diagnosis not present

## 2023-08-08 DIAGNOSIS — R2689 Other abnormalities of gait and mobility: Secondary | ICD-10-CM | POA: Diagnosis not present

## 2023-08-14 DIAGNOSIS — R2689 Other abnormalities of gait and mobility: Secondary | ICD-10-CM | POA: Diagnosis not present

## 2023-08-15 ENCOUNTER — Ambulatory Visit (INDEPENDENT_AMBULATORY_CARE_PROVIDER_SITE_OTHER): Payer: Medicare PPO | Admitting: Otolaryngology

## 2023-08-15 ENCOUNTER — Encounter (INDEPENDENT_AMBULATORY_CARE_PROVIDER_SITE_OTHER): Payer: Self-pay

## 2023-08-15 VITALS — BP 126/88 | HR 80 | Resp 19 | Ht 70.0 in | Wt 138.0 lb

## 2023-08-15 DIAGNOSIS — R09A2 Foreign body sensation, throat: Secondary | ICD-10-CM | POA: Diagnosis not present

## 2023-08-15 DIAGNOSIS — R1319 Other dysphagia: Secondary | ICD-10-CM | POA: Diagnosis not present

## 2023-08-15 DIAGNOSIS — J387 Other diseases of larynx: Secondary | ICD-10-CM

## 2023-08-15 DIAGNOSIS — R2689 Other abnormalities of gait and mobility: Secondary | ICD-10-CM | POA: Diagnosis not present

## 2023-08-15 NOTE — Progress Notes (Signed)
Dear Dr. Tasia Catchings, Here is my assessment for our mutual patient, Corey Frederick. Thank you for allowing me the opportunity to care for your patient. Please do not hesitate to contact me should you have any other questions. Sincerely, Dr. Jovita Kussmaul  Otolaryngology Clinic Note Referring provider: Dr. Tasia Catchings HPI:  Corey Frederick is a 89 y.o. male kindly referred by Dr. Tasia Catchings for evaluation of globus sensation  Initial visit (08/2023): Patient reports: globus sensation for several years, longstanding. He reports that he had an episode where a popcorn husk got in his throat several years ago and then had a sensation of lump in the throat. He reports it is not significantly bothersome, he has gotten used to it. No regurgitation or halitosis. He reports that he does not really have any trouble swallowing.  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 Alcohol: no. 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 Tasia Catchings (GI) 04/30/2023: noted lack of appetite, globus sensation, throat clearing; reflux. 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; no significant concerning adenopathy noted in neck; airway patent PMH/Meds/All/SocHx/FamHx/ROS:   Past Medical History:  Diagnosis Date   Anxiety    Depression    GERD (gastroesophageal reflux disease)    Hypertension    Hypothyroidism    Pre-diabetes      Past Surgical History:  Procedure Laterality Date   ANTERIOR CERVICAL DECOMP/DISCECTOMY FUSION N/A 07/28/2020   Procedure: [name] ANTERIOR CERVICAL DECOMPRESSION/DISCECTOMY FUSION, ALLOGRAFT, PLATE;  Surgeon: Eldred Manges, MD;  Location: [facility];  Service: Orthopedics;  Laterality: N/A;   BALLOON DILATION  05/31/2023   Procedure: BALLOON DILATION;  Surgeon: Franky Macho, MD;  [facility];  Service: Endoscopy;;   BALLOON DILATION  07/12/2023   Procedure: [place];  Surgeon: Franky Macho, MD;  [facility];  Service: Endoscopy;;  pylorus   BIOPSY  05/31/2023   Procedure: BIOPSY;  Surgeon: Franky Macho, MD;  [facility];  Service: Endoscopy;;   COLONOSCOPY     5 years ago - Dr. Darrick Penna   ESOPHAGOGASTRODUODENOSCOPY N/A 06/18/2015   Procedure: ESOPHAGOGASTRODUODENOSCOPY (EGD);  Surgeon: Malissa Hippo, MD;  [facility];  Service: Endoscopy;  Laterality: N/A;  155 - moved to 2:40 - [name] notified pt   ESOPHAGOGASTRODUODENOSCOPY (EGD) WITH PROPOFOL N/A 05/31/2023   Procedure: ESOPHAGOGASTRODUODENOSCOPY (EGD) WITH PROPOFOL;  Surgeon: Franky Macho, MD;  [facility];  Service: Endoscopy;  Laterality: N/A;  12:45pm;asa 3   ESOPHAGOGASTRODUODENOSCOPY (EGD) WITH PROPOFOL N/A 07/12/2023   Procedure: ESOPHAGOGASTRODUODENOSCOPY (EGD) WITH PROPOFOL;  Surgeon: Sanjuan Dame  F, MD;  [facility];  Service: Endoscopy;  Laterality: N/A;  9:15AM;ASA 3    Family History  Problem Relation Age of Onset    Anxiety disorder Mother    Heart disease Father    Emphysema Sister    Diabetes Brother      Social Connections: Not on file      Current Outpatient Medications:    acetaminophen (TYLENOL) 500 MG tablet, Take 500 mg by mouth every 8 (eight) hours as needed., Disp: , Rfl:    donepezil (ARICEPT) 10 MG tablet, Take 10 mg by mouth daily., Disp: , Rfl:    FLUoxetine (PROZAC) 10 MG capsule, Take 10 mg by mouth daily., Disp: , Rfl:    GEMTESA 75 MG TABS, Take by mouth daily., Disp: , Rfl:    levocetirizine (XYZAL) 5 MG tablet, Take 5 mg by mouth every evening., Disp: , Rfl:    levothyroxine (SYNTHROID) 25 MCG tablet, Take 25 mcg by mouth daily before breakfast., Disp: , Rfl:    loperamide (IMODIUM) 2 MG capsule, Take by mouth as needed for diarrhea or loose stools., Disp: , Rfl:    LORazepam (ATIVAN) 1 MG tablet, Take 1 mg by mouth at bedtime., Disp: , Rfl:    memantine (NAMENDA) 10 MG tablet, Take 10 mg by mouth at bedtime., Disp: , Rfl:    Metformin HCl 500 MG/5ML SOLN, Take 5 mLs by mouth 2 (two) times daily., Disp: , Rfl:    omeprazole (PRILOSEC) 40 MG capsule, Take 1 capsule (40 mg total) by mouth daily., Disp: 90 capsule, Rfl: 0   propranolol (INDERAL) 10 MG tablet, Take 10 mg by mouth 2 (two) times daily., Disp: , Rfl:    ramipril (ALTACE) 5 MG capsule, Take 5 mg by mouth daily., Disp: , Rfl:    rosuvastatin (CRESTOR) 10 MG tablet, Take 10 mg by mouth daily., Disp: , Rfl:    sildenafil (VIAGRA) 50 MG tablet, Take 50 mg by mouth daily as needed for erectile dysfunction., Disp: , Rfl:    tamsulosin (FLOMAX) 0.4 MG CAPS capsule, Take 0.4 mg by mouth daily., Disp: , Rfl:    Physical Exam:   BP 126/88 (BP Location: Left Arm, Patient Position: Sitting, Cuff Size: Normal)   Pulse 80   Resp 19   Ht 5\' 10"  (1.778 m)   Wt 138 lb (62.6 kg)   SpO2 94%   BMI 19.80 kg/m   Salient findings:  CN II-XII intact  Bilateral EAC clear and TM intact with well pneumatized middle ear spaces Anterior  rhinoscopy: Septum relatively midline; bilateral inferior turbinates without significant hypertrophy No lesions of oral cavity/oropharynx; palpable tongue base without masses No obviously palpable neck masses/lymphadenopathy/thyromegaly No respiratory distress or stridor; no significant throat clearing today; TFL was indicated to better evaluate the proximal airway, given the patient's history and exam findings, and is detailed below.  Seprately Identifiable Procedures:  Procedure Note Pre-procedure diagnosis: globus sensation, vallecular cyst Post-procedure diagnosis: Same Procedure: Transnasal Fiberoptic Laryngoscopy, CPT 31575 - Mod 25 Indication: see above Complications: None apparent EBL: 0 mL  The procedure was undertaken to further evaluate the patient's complaint of globus sensation and vallecular cysts, with mirror exam inadequate for appropriate examination due to gag reflex and poor patient tolerance  Procedure:  Patient was identified as correct patient. Verbal consent was obtained. The nose was sprayed with oxymetazoline and 4% lidocaine. The The flexible laryngoscope was passed through the nose to view the nasal cavity, pharynx (oropharynx, hypopharynx) and  larynx.  The larynx was examined at rest and during multiple phonatory tasks. Documentation was obtained and reviewed with patient. The scope was removed. The patient tolerated the procedure well.  Findings: The nasal cavity and nasopharynx did not reveal any masses or lesions, mucosa appeared to be without obvious lesions. The tongue base, pharyngeal walls, piriform sinuses, vallecula, epiglottis and postcricoid region are normal in appearance EXCEPT: multiple small cystic masses non-obstructive in vallecula just right of midline; no significant retained secretions. The visualized portion of the subglottis and proximal trachea is widely patent. The vocal folds are mobile bilaterally. There are no lesions on the free edge of the  vocal folds nor elsewhere in the larynx worrisome for malignancy.             Electronically signed by: Read Drivers, MD 08/26/2023 2:19 PM   Impression & Plans:  Dorion Petillo is a 89 y.o. male with:  1. Globus sensation   2. Vallecular cyst   3. Esophageal dysphagia    TFL reassuring, does appear to be vallecular cysts and do not note any other lesions; CT without other masses; we discussed options -- observation and bx/marsupialization He reports he'd like to observe; certainly could contribute to globus but globus is likely multifactorial here.  Follow up 1 year for repeat check, sooner if any concerns  See below regarding exact medications prescribed this encounter including dosages and route: No orders of the defined types were placed in this encounter.     Thank you for allowing me the opportunity to care for your patient. Please do not hesitate to contact me should you have any other questions.  Sincerely, Jovita Kussmaul, MD Otolarynoglogist (ENT), [facility] Phone: [phone] Fax: [phone]  08/26/2023, 2:19 PM   MDM:  Level 4 Complexity/Problems addressed: mod - multiple chronic problems Data complexity: mod - independent review and interpretation of CT imaging - Morbidity: low/unclear  - Prescription Drug prescribed or managed: no

## 2023-08-22 DIAGNOSIS — R2689 Other abnormalities of gait and mobility: Secondary | ICD-10-CM | POA: Diagnosis not present

## 2023-08-23 ENCOUNTER — Ambulatory Visit (HOSPITAL_COMMUNITY)
Admission: RE | Admit: 2023-08-23 | Discharge: 2023-08-23 | Disposition: A | Discharge status: Home / Self Care | Payer: Medicare PPO | Source: Ambulatory Visit | Attending: Gastroenterology | Admitting: Gastroenterology

## 2023-08-23 DIAGNOSIS — K5710 Diverticulosis of small intestine without perforation or abscess without bleeding: Secondary | ICD-10-CM | POA: Diagnosis not present

## 2023-08-23 DIAGNOSIS — N200 Calculus of kidney: Secondary | ICD-10-CM | POA: Diagnosis not present

## 2023-08-23 DIAGNOSIS — K311 Adult hypertrophic pyloric stenosis: Secondary | ICD-10-CM | POA: Insufficient documentation

## 2023-08-23 DIAGNOSIS — R2689 Other abnormalities of gait and mobility: Secondary | ICD-10-CM | POA: Diagnosis not present

## 2023-08-23 DIAGNOSIS — I7143 Infrarenal abdominal aortic aneurysm, without rupture: Secondary | ICD-10-CM | POA: Diagnosis not present

## 2023-08-23 DIAGNOSIS — K30 Functional dyspepsia: Secondary | ICD-10-CM | POA: Diagnosis not present

## 2023-08-23 MED ORDER — IOHEXOL 300 MG/ML  SOLN
100.0000 mL | Freq: Once | INTRAMUSCULAR | Status: AC | PRN
  Administered 2023-08-23: 100 mL via INTRAVENOUS

## 2023-08-27 DIAGNOSIS — R2689 Other abnormalities of gait and mobility: Secondary | ICD-10-CM | POA: Diagnosis not present

## 2023-08-29 DIAGNOSIS — R2689 Other abnormalities of gait and mobility: Secondary | ICD-10-CM | POA: Diagnosis not present

## 2023-09-03 DIAGNOSIS — R2689 Other abnormalities of gait and mobility: Secondary | ICD-10-CM | POA: Diagnosis not present

## 2023-09-06 DIAGNOSIS — R2689 Other abnormalities of gait and mobility: Secondary | ICD-10-CM | POA: Diagnosis not present

## 2023-09-10 DIAGNOSIS — R2689 Other abnormalities of gait and mobility: Secondary | ICD-10-CM | POA: Diagnosis not present

## 2023-09-12 DIAGNOSIS — R2689 Other abnormalities of gait and mobility: Secondary | ICD-10-CM | POA: Diagnosis not present

## 2023-09-17 DIAGNOSIS — R2689 Other abnormalities of gait and mobility: Secondary | ICD-10-CM | POA: Diagnosis not present

## 2023-09-18 DIAGNOSIS — E039 Hypothyroidism, unspecified: Secondary | ICD-10-CM | POA: Diagnosis not present

## 2023-09-18 DIAGNOSIS — I129 Hypertensive chronic kidney disease with stage 1 through stage 4 chronic kidney disease, or unspecified chronic kidney disease: Secondary | ICD-10-CM | POA: Diagnosis not present

## 2023-09-18 DIAGNOSIS — E876 Hypokalemia: Secondary | ICD-10-CM | POA: Diagnosis not present

## 2023-09-18 DIAGNOSIS — E1165 Type 2 diabetes mellitus with hyperglycemia: Secondary | ICD-10-CM | POA: Diagnosis not present

## 2023-09-19 DIAGNOSIS — R2689 Other abnormalities of gait and mobility: Secondary | ICD-10-CM | POA: Diagnosis not present

## 2023-09-24 DIAGNOSIS — R2689 Other abnormalities of gait and mobility: Secondary | ICD-10-CM | POA: Diagnosis not present

## 2023-09-25 DIAGNOSIS — J309 Allergic rhinitis, unspecified: Secondary | ICD-10-CM | POA: Diagnosis not present

## 2023-09-25 DIAGNOSIS — N400 Enlarged prostate without lower urinary tract symptoms: Secondary | ICD-10-CM | POA: Diagnosis not present

## 2023-09-25 DIAGNOSIS — R197 Diarrhea, unspecified: Secondary | ICD-10-CM | POA: Diagnosis not present

## 2023-09-25 DIAGNOSIS — R413 Other amnesia: Secondary | ICD-10-CM | POA: Diagnosis not present

## 2023-09-25 DIAGNOSIS — I129 Hypertensive chronic kidney disease with stage 1 through stage 4 chronic kidney disease, or unspecified chronic kidney disease: Secondary | ICD-10-CM | POA: Diagnosis not present

## 2023-09-25 DIAGNOSIS — R350 Frequency of micturition: Secondary | ICD-10-CM | POA: Diagnosis not present

## 2023-09-25 DIAGNOSIS — E782 Mixed hyperlipidemia: Secondary | ICD-10-CM | POA: Diagnosis not present

## 2023-09-25 DIAGNOSIS — E1165 Type 2 diabetes mellitus with hyperglycemia: Secondary | ICD-10-CM | POA: Diagnosis not present

## 2023-09-25 DIAGNOSIS — N189 Chronic kidney disease, unspecified: Secondary | ICD-10-CM | POA: Diagnosis not present

## 2023-09-26 DIAGNOSIS — R2689 Other abnormalities of gait and mobility: Secondary | ICD-10-CM | POA: Diagnosis not present

## 2023-10-01 DIAGNOSIS — R2689 Other abnormalities of gait and mobility: Secondary | ICD-10-CM | POA: Diagnosis not present

## 2023-10-03 DIAGNOSIS — R2689 Other abnormalities of gait and mobility: Secondary | ICD-10-CM | POA: Diagnosis not present

## 2023-10-08 DIAGNOSIS — R2689 Other abnormalities of gait and mobility: Secondary | ICD-10-CM | POA: Diagnosis not present

## 2023-10-10 DIAGNOSIS — R2689 Other abnormalities of gait and mobility: Secondary | ICD-10-CM | POA: Diagnosis not present

## 2023-10-12 ENCOUNTER — Ambulatory Visit (INDEPENDENT_AMBULATORY_CARE_PROVIDER_SITE_OTHER): Payer: Medicare PPO | Admitting: Gastroenterology

## 2023-10-15 DIAGNOSIS — R2689 Other abnormalities of gait and mobility: Secondary | ICD-10-CM | POA: Diagnosis not present

## 2023-10-15 NOTE — Progress Notes (Unsigned)
 [facility] [place]  ASSESSMENT / PLAN: Corey Frederick is a 89 y.o. male with a infrarenal abdominal aortic aneurysm measuring 31mm.  The Joint Council of the American Association for Vascular Surgery and Society for Vascular Surgery estimates annual rupture risk based on abdominal aneurysm diameter. The patient's estimated risk is {aneurysmrupture:24841}  The patient is *** a candidate for elective repair of the aneurysm to prevent rupture.  I explained the risks / benefits / alternatives to different approaches to aortic reconstruction.   I explained the specific benefits of open repair including improved durability, limited requirement for surveillance, less need for secondary intervention. I explained the specific risks from open repair including higher physiologic stress from aortic cross clamping, higher risk of perioperative complication (including stroke, MI, pneumonia, renal insufficiency and failure, etc.), higher risk of abdominal wall complications, risk of anastomotic pseudoaneurysm.  I explained the specific benefits of endovascular repair including limited physiologic stress and less recovery time, lower risk of serious perioperative complication, zero risk of abdominal wall complication.  I explained the specific risks from endovascular repair including need for lifetime surveillance, risk of large-bore arterial access, risk of requiring secondary intervention to maintain seal or patency. I explained that not all patients are candidates for endovascular repair based on their unique anatomy.  After detailed discussion the patient and I agree that the best option for the patient is ***.   Recommend:  Abstinence from all tobacco products. Blood glucose control with goal A1c < 7%. Blood pressure control with goal blood pressure < 140/90 mmHg. Lipid reduction therapy with goal LDL-C <100 mg/dL.  Aspirin 81mg  PO QD.  *** Clopidogrel 75mg  PO QD. ***  Rivaroxaban 2.5mg  PO BID. *** Cilostozal 100mg  PO BID for intermittent claudication without evidence of heart failure. Atorvastatin 40-80mg  PO QD (or other "high intensity" statin therapy).  Follow up 3 years with AAA duplex  CHIEF COMPLAINT: ***  HISTORY OF PRESENT ILLNESS: Corey Frederick is a 89 y.o. male ***  VASCULAR SURGICAL HISTORY: ***  VASCULAR RISK FACTORS: {FINDINGS; POSITIVE NEGATIVE:[id number]} history of stroke / transient ischemic attack. {FINDINGS; POSITIVE NEGATIVE:[id number]} history of coronary artery disease. *** history of PCI. *** history of CABG.  {FINDINGS; POSITIVE NEGATIVE:[id number]} history of diabetes mellitus. Last A1c ***. {FINDINGS; POSITIVE NEGATIVE:[id number]} history of smoking. *** actively smoking. {FINDINGS; POSITIVE NEGATIVE:[id number]} history of hypertension. *** drug regimen with *** control. {FINDINGS; POSITIVE NEGATIVE:[id number]} history of chronic kidney disease.  Last GFR ***. CKD {stage:30421363}. {FINDINGS; POSITIVE NEGATIVE:[id number]} history of chronic obstructive pulmonary disease, treated with ***.  FUNCTIONAL STATUS: ECOG performance status: {findings; ecog performance status:31780} Ambulatory status: {TNHAmbulation:25868}  CAREY 1 AND 3 YEAR INDEX Male (2pts) 75-79 or 80-84 (2pts) >84 (3pts) Dependence in toileting (1pt) Partial or full dependence in dressing (1pt) History of malignant neoplasm (2pts) CHF (3pts) COPD (1pts) CKD (3pts)  0-3 pts 6% 1 year mortality ; 21% 3 year mortality 4-5 pts 12% 1 year mortality ; 36% 3 year mortality >5 pts 21% 1 year mortality; 54% 3 year mortality   Past Medical History:  Diagnosis Date   Anxiety    Depression    GERD (gastroesophageal reflux disease)    Hypertension    Hypothyroidism    Pre-diabetes     Past Surgical History:  Procedure Laterality Date   ANTERIOR CERVICAL DECOMP/DISCECTOMY FUSION N/A 07/28/2020   Procedure: [name] ANTERIOR CERVICAL  DECOMPRESSION/DISCECTOMY FUSION, ALLOGRAFT, PLATE;  Surgeon: Eldred Manges, MD;  [facility];  Service: Orthopedics;  Laterality: N/A;  BALLOON DILATION  05/31/2023   Procedure: BALLOON DILATION;  Surgeon: Franky Macho, MD;  [facility];  Service: Endoscopy;;   BALLOON DILATION  07/12/2023   Procedure: [place];  Surgeon: Franky Macho, MD;  [facility];  Service: Endoscopy;;  pylorus   BIOPSY  05/31/2023   Procedure: BIOPSY;  Surgeon: Franky Macho, MD;  [facility];  Service: Endoscopy;;   COLONOSCOPY     5 years ago - Dr. Darrick Penna   ESOPHAGOGASTRODUODENOSCOPY N/A 06/18/2015   Procedure: ESOPHAGOGASTRODUODENOSCOPY (EGD);  Surgeon: Malissa Hippo, MD;  [facility];  Service: Endoscopy;  Laterality: N/A;  155 - moved to 2:40 - [name] notified pt   ESOPHAGOGASTRODUODENOSCOPY (EGD) WITH PROPOFOL N/A 05/31/2023   Procedure: ESOPHAGOGASTRODUODENOSCOPY (EGD) WITH PROPOFOL;  Surgeon: Franky Macho, MD;  [facility];  Service: Endoscopy;  Laterality: N/A;  12:45pm;asa 3   ESOPHAGOGASTRODUODENOSCOPY (EGD) WITH PROPOFOL N/A 07/12/2023   Procedure: ESOPHAGOGASTRODUODENOSCOPY (EGD) WITH PROPOFOL;  Surgeon: Franky Macho, MD;  [facility];  Service: Endoscopy;  Laterality: N/A;  9:15AM;ASA 3    Family History  Problem Relation Age of Onset   Anxiety disorder Mother    Heart disease Father    Emphysema Sister    Diabetes Brother     Social History   Socioeconomic History   Marital status: Married    Spouse name: Not on file   Number of children: Not on file   Years of education: Not on file   Highest education level: Not on file  Occupational History   Not on file  Tobacco Use   Smoking status: Former   Smokeless tobacco: Never  Vaping Use   Vaping status: Never Used  Substance and Sexual Activity   Alcohol use: Yes    Alcohol/week: 0.0 standard drinks of alcohol    Comment: Social    Drug use: Never   Sexual activity: Not on file  Other Topics Concern   Not on file  Social History Narrative   Not on file   Social Drivers of Health   Financial Resource Strain: Not on file  Food Insecurity: Not on file  Transportation Needs: Not on file  Physical Activity: Not on file  Stress: Not on file  Social Connections: Not on file  Intimate Partner Violence: Not on file    No Known Allergies  Current Outpatient Medications  Medication Sig Dispense Refill   acetaminophen (TYLENOL) 500 MG tablet Take 500 mg by mouth every 8 (eight) hours as needed.     donepezil (ARICEPT) 10 MG tablet Take 10 mg by mouth daily.     FLUoxetine (PROZAC) 10 MG capsule Take 10 mg by mouth daily.     GEMTESA 75 MG TABS Take by mouth daily.     levocetirizine (XYZAL) 5 MG tablet Take 5 mg by mouth every evening.     levothyroxine (SYNTHROID) 25 MCG tablet Take 25 mcg by mouth daily before breakfast.     loperamide (IMODIUM) 2 MG capsule Take by mouth as needed for diarrhea or loose stools.     LORazepam (ATIVAN) 1 MG tablet Take 1 mg by mouth at bedtime.     memantine (NAMENDA) 10 MG tablet Take 10 mg by mouth at bedtime.     Metformin HCl 500 MG/5ML SOLN Take 5 mLs by mouth 2 (two) times daily.     omeprazole (PRILOSEC) 40 MG capsule Take 1 capsule (40 mg total) by  mouth daily. 90 capsule 0   propranolol (INDERAL) 10 MG tablet Take 10 mg by mouth 2 (two) times daily.     ramipril (ALTACE) 5 MG capsule Take 5 mg by mouth daily.     rosuvastatin (CRESTOR) 10 MG tablet Take 10 mg by mouth daily.     sildenafil (VIAGRA) 50 MG tablet Take 50 mg by mouth daily as needed for erectile dysfunction.     tamsulosin (FLOMAX) 0.4 MG CAPS capsule Take 0.4 mg by mouth daily.     No current facility-administered medications for this visit.    PHYSICAL EXAM There were no vitals filed for this visit.  Constitutional: *** appearing. *** distress. Appears *** nourished.  Neurologic: CN ***. *** focal  findings. *** sensory loss. Psychiatric: *** Mood and affect symmetric and appropriate. Eyes: *** No icterus. No conjunctival pallor. Ears, nose, throat: *** mucous membranes moist. Midline trachea.  Cardiac: *** rate and rhythm.  Respiratory: *** unlabored. Abdominal: *** soft, non-tender, non-distended.  Peripheral vascular: *** Extremity: *** edema. *** cyanosis. *** pallor.  Skin: *** gangrene. *** ulceration.  Lymphatic: *** Stemmer's sign. *** palpable lymphadenopathy.    PERTINENT LABORATORY AND RADIOLOGIC DATA  Most recent CBC    Latest Ref Rng & Units 02/07/2022    7:38 PM 05/17/2021   11:53 PM 07/26/2020    2:00 PM  CBC  WBC 4.0 - 10.5 K/uL 5.4  5.6  6.5   Hemoglobin 13.0 - 17.0 g/dL 29.5  62.1  30.8   Hematocrit 39.0 - 52.0 % 37.3  40.9  43.7   Platelets 150 - 400 K/uL 138  221  221      Most recent CMP    Latest Ref Rng & Units 07/10/2023   11:50 AM 02/07/2022    9:35 PM 05/17/2021   11:53 PM  CMP  Glucose 70 - 99 mg/dL 657  846  962   BUN 8 - 23 mg/dL 15  15  17    Creatinine 0.61 - 1.24 mg/dL 9.52  8.41  3.24   Sodium 135 - 145 mmol/L 138  142  139   Potassium 3.5 - 5.1 mmol/L 3.6  3.4  4.1   Chloride 98 - 111 mmol/L 106  110  104   CO2 22 - 32 mmol/L 21  24  27    Calcium 8.9 - 10.3 mg/dL 8.4  8.7  9.5   Total Protein 6.5 - 8.1 g/dL  6.0  7.0   Total Bilirubin 0.3 - 1.2 mg/dL  1.0  1.1   Alkaline Phos 38 - 126 U/L  50  45   AST 15 - 41 U/L  23  27   ALT 0 - 44 U/L  20  25     Renal function CrCl cannot be calculated (Patient's most recent lab result is older than the maximum 21 days allowed.).  Hgb A1c MFr Bld (%)  Date Value  07/26/2020 6.4 (H)   CT scan of abdomen / pelvis: 31mm saccular infrarenal abdominal aortic aneurysm.   Rande Brunt. Lenell Antu, MD FACS [facility] Phone Number: [phone] 10/15/2023 8:33 PM   Total time spent on preparing this encounter including chart review, data review,  collecting history, examining the patient, coordinating care for this new patient, 60 minutes.  Portions of this report may have been transcribed using voice recognition software.  Every effort has been made to ensure accuracy; however, inadvertent computerized transcription errors may still be present.

## 2023-10-16 ENCOUNTER — Ambulatory Visit (INDEPENDENT_AMBULATORY_CARE_PROVIDER_SITE_OTHER): Payer: Medicare PPO | Admitting: Vascular Surgery

## 2023-10-16 ENCOUNTER — Encounter: Payer: Self-pay | Admitting: Vascular Surgery

## 2023-10-16 DIAGNOSIS — I7143 Infrarenal abdominal aortic aneurysm, without rupture: Secondary | ICD-10-CM | POA: Diagnosis not present

## 2023-10-17 ENCOUNTER — Ambulatory Visit (INDEPENDENT_AMBULATORY_CARE_PROVIDER_SITE_OTHER): Payer: Medicare PPO | Admitting: Gastroenterology

## 2023-10-17 ENCOUNTER — Encounter (INDEPENDENT_AMBULATORY_CARE_PROVIDER_SITE_OTHER): Payer: Self-pay | Admitting: Gastroenterology

## 2023-10-17 VITALS — BP 143/93 | HR 73

## 2023-10-17 DIAGNOSIS — R935 Abnormal findings on diagnostic imaging of other abdominal regions, including retroperitoneum: Secondary | ICD-10-CM

## 2023-10-17 DIAGNOSIS — K219 Gastro-esophageal reflux disease without esophagitis: Secondary | ICD-10-CM

## 2023-10-17 DIAGNOSIS — K311 Adult hypertrophic pyloric stenosis: Secondary | ICD-10-CM

## 2023-10-17 NOTE — Progress Notes (Signed)
 Corey Frederick , M.D. Gastroenterology & Hepatology [facility] Gastroenterology [address] [place], [place] [address] Primary Care Physician: Benita Stabile, MD [address] [place] [address]  Chief Complaint: Follow-up  History of Present Illness:  Corey Frederick is a 89 y.o. male with anxiety, depression, HTN with pyloric peptic stricture s/p dilation is here for follow up on abnormal CT with rectal wall thickening  Patient reports that after 2 set of pyloric stenosis dilation his symptoms of abdominal discomfort and globus sensation has resolved and he is feeling well.  Patient does not have any GI complaints at this time.  He was seen by vascular surgery yesterday for AAA  Last EGD:07/2023  - Large stomach likely accomodation from incomplete gastric outlet obstruction from pyloric peptic stricture . - Zenker' s diverticulum. 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 Dilated. (TTS 12mm)  - Normal duodenal bulb. Unable to examine second portion of duodenum given abnormal anatomy - No specimens collected.  A. PYLORIC STRICTURE, BIOPSY:  Gastric pyloric mucosa with hyperemia and slight chronic inflammation.  Negative for Helicobacter pylori.  Negative for dysplasia and malignancy.   B. STOMACH, BIOPSY:  Gastric antral and oxyntic mucosa with hyperemia.  Negative for Helicobacter pylori.     2016  Mildly dilated body of the esophagus. Small sliding hiatal hernia. Markedly dilated stomach with deformed and narrowed pylorus. Limited examination of duodenal bulb and second part of the duodenum could not be examined.  Comment: I am concerned he may have GI pseudoobstruction.  Last Colonoscopy:over 10 years ago , no records available   FHx: neg for any gastrointestinal/liver disease, no malignancies Social: neg smoking, alcohol or illicit drug use  Past Medical History: Past Medical History:  Diagnosis Date   Anxiety    Depression     GERD (gastroesophageal reflux disease)    Hypertension    Hypothyroidism    Pre-diabetes     Past Surgical History: Past Surgical History:  Procedure Laterality Date   ANTERIOR CERVICAL DECOMP/DISCECTOMY FUSION N/A 07/28/2020   Procedure: [name] ANTERIOR CERVICAL DECOMPRESSION/DISCECTOMY FUSION, ALLOGRAFT, PLATE;  Surgeon: Eldred Manges, MD;  Location: [facility];  Service: Orthopedics;  Laterality: N/A;   BALLOON DILATION  05/31/2023   Procedure: BALLOON DILATION;  Surgeon: Franky Macho, MD;  [facility];  Service: Endoscopy;;   BALLOON DILATION  07/12/2023   Procedure: [place]ye [place];  Surgeon: Franky Macho, MD;  [facility];  Service: Endoscopy;;  pylorus   BIOPSY  05/31/2023   Procedure: BIOPSY;  Surgeon: Franky Macho, MD;  [facility];  Service: Endoscopy;;   COLONOSCOPY     5 years ago - Dr. Darrick Penna   ESOPHAGOGASTRODUODENOSCOPY N/A 06/18/2015   Procedure: ESOPHAGOGASTRODUODENOSCOPY (EGD);  Surgeon: Malissa Hippo, MD;  [facility];  Service: Endoscopy;  Laterality: N/A;  155 - moved to 2:40 - [name] notified pt   ESOPHAGOGASTRODUODENOSCOPY (EGD) WITH PROPOFOL N/A 05/31/2023   Procedure: ESOPHAGOGASTRODUODENOSCOPY (EGD) WITH PROPOFOL;  Surgeon: Franky Macho, MD;  [facility];  Service: Endoscopy;  Laterality: N/A;  12:45pm;asa 3   ESOPHAGOGASTRODUODENOSCOPY (EGD) WITH PROPOFOL N/A 07/12/2023   Procedure: ESOPHAGOGASTRODUODENOSCOPY (EGD) WITH PROPOFOL;  Surgeon: Franky Macho, MD;  [facility];  Service: Endoscopy;  Laterality: N/A;  9:15AM;ASA 3    Family History: Family History  Problem Relation Age of Onset   Anxiety disorder Mother    Heart disease Father  Emphysema Sister    Diabetes Brother     Social History: Social History   Tobacco Use  Smoking Status Former  Smokeless Tobacco Never   Social History   Substance and Sexual Activity  Alcohol Use Yes    Alcohol/week: 0.0 standard drinks of alcohol   Comment: Social   Social History   Substance and Sexual Activity  Drug Use Never    Allergies: No Known Allergies  Medications: Current Outpatient Medications  Medication Sig Dispense Refill   acetaminophen (TYLENOL) 500 MG tablet Take 500 mg by mouth every 8 (eight) hours as needed.     donepezil (ARICEPT) 10 MG tablet Take 10 mg by mouth daily.     FLUoxetine (PROZAC) 10 MG capsule Take 10 mg by mouth daily.     GEMTESA 75 MG TABS Take by mouth daily.     levocetirizine (XYZAL) 5 MG tablet Take 5 mg by mouth every evening.     levothyroxine (SYNTHROID) 25 MCG tablet Take 25 mcg by mouth daily before breakfast.     loperamide (IMODIUM) 2 MG capsule Take by mouth as needed for diarrhea or loose stools.     LORazepam (ATIVAN) 1 MG tablet Take 1 mg by mouth at bedtime.     memantine (NAMENDA) 10 MG tablet Take 10 mg by mouth at bedtime.     Metformin HCl 500 MG/5ML SOLN Take 5 mLs by mouth 2 (two) times daily.     pantoprazole (PROTONIX) 40 MG tablet Take 40 mg by mouth daily.     propranolol (INDERAL) 10 MG tablet Take 10 mg by mouth 2 (two) times daily.     ramipril (ALTACE) 5 MG capsule Take 5 mg by mouth daily.     rosuvastatin (CRESTOR) 10 MG tablet Take 10 mg by mouth daily.     sildenafil (VIAGRA) 50 MG tablet Take 50 mg by mouth daily as needed for erectile dysfunction.     tamsulosin (FLOMAX) 0.4 MG CAPS capsule Take 0.4 mg by mouth daily.     No current facility-administered medications for this visit.    Review of Systems: GENERAL: negative for malaise, night sweats HEENT: No changes in hearing or vision, no nose bleeds or other nasal problems. NECK: Negative for lumps, goiter, pain and significant neck swelling RESPIRATORY: Negative for cough, wheezing CARDIOVASCULAR: Negative for chest pain, leg swelling, palpitations, orthopnea GI: SEE HPI MUSCULOSKELETAL: Negative for joint pain or swelling, back pain, and muscle  pain. SKIN: Negative for lesions, rash HEMATOLOGY Negative for prolonged bleeding, bruising easily, and swollen nodes. ENDOCRINE: Negative for cold or heat intolerance, polyuria, polydipsia and goiter. NEURO: negative for tremor, gait imbalance, syncope and seizures. The remainder of the review of systems is noncontributory.   Physical Exam: BP (!) 143/93 (BP Location: Left Arm, Patient Position: Sitting, Cuff Size: Normal)   Pulse 73  GENERAL: The patient is AO x3, in no acute distress. HEENT: Head is normocephalic and atraumatic. EOMI are intact. Mouth is well hydrated and without lesions. NECK: Supple. No masses LUNGS: Clear to auscultation. No presence of rhonchi/wheezing/rales. Adequate chest expansion HEART: RRR, normal s1 and s2. ABDOMEN: Soft, nontender, no guarding, no peritoneal signs, and nondistended. BS +. No masses. EXTREMITIES: Without any cyanosis, clubbing, rash, lesions or edema. NEUROLOGIC: AOx3, no focal motor deficit. SKIN: no jaundice, no rashes   Imaging/Labs: as above     Latest Ref Rng & Units 02/07/2022    7:38 PM 05/17/2021   11:53 PM 07/26/2020    2:00  PM  CBC  WBC 4.0 - 10.5 K/uL 5.4  5.6  6.5   Hemoglobin 13.0 - 17.0 g/dL 40.9  81.1  91.4   Hematocrit 39.0 - 52.0 % 37.3  40.9  43.7   Platelets 150 - 400 K/uL 138  221  221    No results found for: "IRON", "TIBC", "FERRITIN"  I personally reviewed and interpreted the available labs, imaging and endoscopic files.  CT Soft tissue NECK  1. Small low densities at the vallecula, usually retention cysts. These could be evaluated with endoscopy. 2. No adenopathy or deep space mass throughout the neck.   CT Abdomen pelvis 08/2023  IMPRESSION: 1. Marked luminal narrowing in the region of the pylorus without extrinsic mass lesion or focal mural thickening, in keeping with reported pyloric stricture. No findings of gastric outlet obstruction. Enteric contrast material reaches the level of  the cecum. 2. Short-segment mild mural thickening of the rectum, which may be related to peristalsis or proctitis. Consider correlation with colonoscopy to exclude underlying mass lesion in this area if one has not been performed recently. 3. Peripherally calcified ovoid densities in the left hemiabdomen may reflect sequela of prior infection/inflammation. Additional 8 mm nodule along the anterolateral left upper quadrant, nonspecific. 4. Saccular infrarenal abdominal aortic aneurysm measures 3.1 x 3.0 cm. Recommend referral to or continued care with vascular specialist. (Ref.: J Vasc Surg. 2018; 67:2-77 and J Am Coll Radiol 2013;10(10):789-794.) 5. Punctate nonobstructing left upper pole renal stone. 6.  Aortic Atherosclerosis (ICD10-I70.0).  Impression and Plan:  Corey Frederick is a 89 y.o. male with anxiety, depression, HTN with pyloric peptic stricture s/p dilation is here for follow up on abnormal CT with rectal wall thickening  #Abnormal CT with rectal wall thickening   I discussed with patient extensively the recent CT finding of rectal wall thickening which could be proctitis, peristalsis but without colonoscopy or flexible sigmoidoscopy I cannot rule out malignancy.  After discussing risk-benefit indication of colonoscopy and flexible sigmoidoscopy patient would like to defer endoscopic evaluation at this time.  He is awake alert oriented x 3 and does seem to have insight of his medical conditions.  he does verbalizes understanding that without colonoscopy or flexible sigmoidoscopy I cannot rule out malignancy especially last colonoscopy over 10 years ago.  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      Corey Lawman, MD Gastroenterology and Hepatology [facility] Gastroenterology   This chart has been completed using [facility] Dictation software, and while attempts have been made to ensure accuracy , certain words and phrases may not be transcribed as intended

## 2023-10-17 NOTE — Patient Instructions (Addendum)
 It was very nice to meet you today, as dicussed with will plan for the following :  1) if you change your mind about colonoscopy , let us know   2) Avoid using high dose aspirin including Goody/BC powders, NSAIDs such as Aleve, ibuprofen, naproxen, Motrin, Voltaren or Advil (even the topical ones)  3) Continue pantoprazole daily 30 min before breakfast

## 2023-10-18 DIAGNOSIS — R2689 Other abnormalities of gait and mobility: Secondary | ICD-10-CM | POA: Diagnosis not present

## 2023-10-22 DIAGNOSIS — R2689 Other abnormalities of gait and mobility: Secondary | ICD-10-CM | POA: Diagnosis not present

## 2023-10-24 DIAGNOSIS — R2689 Other abnormalities of gait and mobility: Secondary | ICD-10-CM | POA: Diagnosis not present

## 2023-10-29 DIAGNOSIS — R2689 Other abnormalities of gait and mobility: Secondary | ICD-10-CM | POA: Diagnosis not present

## 2023-10-31 DIAGNOSIS — R2689 Other abnormalities of gait and mobility: Secondary | ICD-10-CM | POA: Diagnosis not present

## 2023-11-05 DIAGNOSIS — R2689 Other abnormalities of gait and mobility: Secondary | ICD-10-CM | POA: Diagnosis not present

## 2023-11-07 DIAGNOSIS — R2689 Other abnormalities of gait and mobility: Secondary | ICD-10-CM | POA: Diagnosis not present

## 2023-11-19 DIAGNOSIS — R2689 Other abnormalities of gait and mobility: Secondary | ICD-10-CM | POA: Diagnosis not present

## 2023-11-21 DIAGNOSIS — R2689 Other abnormalities of gait and mobility: Secondary | ICD-10-CM | POA: Diagnosis not present

## 2023-11-26 DIAGNOSIS — R2689 Other abnormalities of gait and mobility: Secondary | ICD-10-CM | POA: Diagnosis not present

## 2023-11-28 DIAGNOSIS — R2689 Other abnormalities of gait and mobility: Secondary | ICD-10-CM | POA: Diagnosis not present

## 2023-12-03 DIAGNOSIS — R2689 Other abnormalities of gait and mobility: Secondary | ICD-10-CM | POA: Diagnosis not present

## 2023-12-05 DIAGNOSIS — R2689 Other abnormalities of gait and mobility: Secondary | ICD-10-CM | POA: Diagnosis not present

## 2023-12-10 DIAGNOSIS — R2689 Other abnormalities of gait and mobility: Secondary | ICD-10-CM | POA: Diagnosis not present

## 2023-12-12 DIAGNOSIS — R2689 Other abnormalities of gait and mobility: Secondary | ICD-10-CM | POA: Diagnosis not present

## 2023-12-17 DIAGNOSIS — R2689 Other abnormalities of gait and mobility: Secondary | ICD-10-CM | POA: Diagnosis not present

## 2023-12-19 DIAGNOSIS — R2689 Other abnormalities of gait and mobility: Secondary | ICD-10-CM | POA: Diagnosis not present

## 2023-12-26 DIAGNOSIS — R2689 Other abnormalities of gait and mobility: Secondary | ICD-10-CM | POA: Diagnosis not present

## 2023-12-27 DIAGNOSIS — R2689 Other abnormalities of gait and mobility: Secondary | ICD-10-CM | POA: Diagnosis not present

## 2023-12-31 DIAGNOSIS — R2689 Other abnormalities of gait and mobility: Secondary | ICD-10-CM | POA: Diagnosis not present

## 2024-01-02 DIAGNOSIS — R2689 Other abnormalities of gait and mobility: Secondary | ICD-10-CM | POA: Diagnosis not present

## 2024-01-10 DIAGNOSIS — R2689 Other abnormalities of gait and mobility: Secondary | ICD-10-CM | POA: Diagnosis not present

## 2024-01-14 DIAGNOSIS — R2689 Other abnormalities of gait and mobility: Secondary | ICD-10-CM | POA: Diagnosis not present

## 2024-01-16 DIAGNOSIS — R2689 Other abnormalities of gait and mobility: Secondary | ICD-10-CM | POA: Diagnosis not present

## 2024-01-21 DIAGNOSIS — R2689 Other abnormalities of gait and mobility: Secondary | ICD-10-CM | POA: Diagnosis not present

## 2024-01-23 DIAGNOSIS — R2689 Other abnormalities of gait and mobility: Secondary | ICD-10-CM | POA: Diagnosis not present

## 2024-01-28 DIAGNOSIS — R2689 Other abnormalities of gait and mobility: Secondary | ICD-10-CM | POA: Diagnosis not present

## 2024-01-30 DIAGNOSIS — R2689 Other abnormalities of gait and mobility: Secondary | ICD-10-CM | POA: Diagnosis not present

## 2024-02-04 DIAGNOSIS — R2689 Other abnormalities of gait and mobility: Secondary | ICD-10-CM | POA: Diagnosis not present

## 2024-02-05 DIAGNOSIS — R2689 Other abnormalities of gait and mobility: Secondary | ICD-10-CM | POA: Diagnosis not present

## 2024-02-19 DIAGNOSIS — E876 Hypokalemia: Secondary | ICD-10-CM | POA: Diagnosis not present

## 2024-02-19 DIAGNOSIS — E1165 Type 2 diabetes mellitus with hyperglycemia: Secondary | ICD-10-CM | POA: Diagnosis not present

## 2024-02-19 DIAGNOSIS — I129 Hypertensive chronic kidney disease with stage 1 through stage 4 chronic kidney disease, or unspecified chronic kidney disease: Secondary | ICD-10-CM | POA: Diagnosis not present

## 2024-02-22 DIAGNOSIS — N189 Chronic kidney disease, unspecified: Secondary | ICD-10-CM | POA: Diagnosis not present

## 2024-02-22 DIAGNOSIS — J309 Allergic rhinitis, unspecified: Secondary | ICD-10-CM | POA: Diagnosis not present

## 2024-02-22 DIAGNOSIS — R197 Diarrhea, unspecified: Secondary | ICD-10-CM | POA: Diagnosis not present

## 2024-02-22 DIAGNOSIS — E1165 Type 2 diabetes mellitus with hyperglycemia: Secondary | ICD-10-CM | POA: Diagnosis not present

## 2024-02-22 DIAGNOSIS — N400 Enlarged prostate without lower urinary tract symptoms: Secondary | ICD-10-CM | POA: Diagnosis not present

## 2024-02-22 DIAGNOSIS — M542 Cervicalgia: Secondary | ICD-10-CM | POA: Diagnosis not present

## 2024-02-22 DIAGNOSIS — R2689 Other abnormalities of gait and mobility: Secondary | ICD-10-CM | POA: Diagnosis not present

## 2024-02-22 DIAGNOSIS — I129 Hypertensive chronic kidney disease with stage 1 through stage 4 chronic kidney disease, or unspecified chronic kidney disease: Secondary | ICD-10-CM | POA: Diagnosis not present

## 2024-02-22 DIAGNOSIS — R350 Frequency of micturition: Secondary | ICD-10-CM | POA: Diagnosis not present

## 2024-02-22 DIAGNOSIS — R413 Other amnesia: Secondary | ICD-10-CM | POA: Diagnosis not present

## 2024-02-27 DIAGNOSIS — R2689 Other abnormalities of gait and mobility: Secondary | ICD-10-CM | POA: Diagnosis not present

## 2024-02-29 DIAGNOSIS — R2689 Other abnormalities of gait and mobility: Secondary | ICD-10-CM | POA: Diagnosis not present

## 2024-03-03 DIAGNOSIS — R2689 Other abnormalities of gait and mobility: Secondary | ICD-10-CM | POA: Diagnosis not present

## 2024-03-05 DIAGNOSIS — R2689 Other abnormalities of gait and mobility: Secondary | ICD-10-CM | POA: Diagnosis not present

## 2024-03-10 DIAGNOSIS — R2689 Other abnormalities of gait and mobility: Secondary | ICD-10-CM | POA: Diagnosis not present

## 2024-03-12 DIAGNOSIS — R2689 Other abnormalities of gait and mobility: Secondary | ICD-10-CM | POA: Diagnosis not present

## 2024-03-17 DIAGNOSIS — R2689 Other abnormalities of gait and mobility: Secondary | ICD-10-CM | POA: Diagnosis not present

## 2024-03-19 DIAGNOSIS — R2689 Other abnormalities of gait and mobility: Secondary | ICD-10-CM | POA: Diagnosis not present

## 2024-03-24 DIAGNOSIS — R2689 Other abnormalities of gait and mobility: Secondary | ICD-10-CM | POA: Diagnosis not present

## 2024-03-26 DIAGNOSIS — R2689 Other abnormalities of gait and mobility: Secondary | ICD-10-CM | POA: Diagnosis not present

## 2024-03-31 DIAGNOSIS — R2689 Other abnormalities of gait and mobility: Secondary | ICD-10-CM | POA: Diagnosis not present

## 2024-04-02 DIAGNOSIS — R2689 Other abnormalities of gait and mobility: Secondary | ICD-10-CM | POA: Diagnosis not present

## 2024-04-03 DIAGNOSIS — R32 Unspecified urinary incontinence: Secondary | ICD-10-CM | POA: Diagnosis not present

## 2024-04-03 DIAGNOSIS — B3742 Candidal balanitis: Secondary | ICD-10-CM | POA: Diagnosis not present

## 2024-04-03 DIAGNOSIS — Z23 Encounter for immunization: Secondary | ICD-10-CM | POA: Diagnosis not present

## 2024-04-07 DIAGNOSIS — R2689 Other abnormalities of gait and mobility: Secondary | ICD-10-CM | POA: Diagnosis not present

## 2024-04-09 DIAGNOSIS — R2689 Other abnormalities of gait and mobility: Secondary | ICD-10-CM | POA: Diagnosis not present

## 2024-04-14 DIAGNOSIS — R2689 Other abnormalities of gait and mobility: Secondary | ICD-10-CM | POA: Diagnosis not present

## 2024-04-15 DIAGNOSIS — R2689 Other abnormalities of gait and mobility: Secondary | ICD-10-CM | POA: Diagnosis not present

## 2024-04-21 DIAGNOSIS — R2689 Other abnormalities of gait and mobility: Secondary | ICD-10-CM | POA: Diagnosis not present

## 2024-04-23 DIAGNOSIS — R2689 Other abnormalities of gait and mobility: Secondary | ICD-10-CM | POA: Diagnosis not present

## 2024-04-28 DIAGNOSIS — R2689 Other abnormalities of gait and mobility: Secondary | ICD-10-CM | POA: Diagnosis not present

## 2024-04-30 DIAGNOSIS — R2689 Other abnormalities of gait and mobility: Secondary | ICD-10-CM | POA: Diagnosis not present

## 2024-05-05 DIAGNOSIS — R2689 Other abnormalities of gait and mobility: Secondary | ICD-10-CM | POA: Diagnosis not present

## 2024-05-07 DIAGNOSIS — R2689 Other abnormalities of gait and mobility: Secondary | ICD-10-CM | POA: Diagnosis not present

## 2024-05-12 DIAGNOSIS — R2689 Other abnormalities of gait and mobility: Secondary | ICD-10-CM | POA: Diagnosis not present

## 2024-05-14 DIAGNOSIS — R2689 Other abnormalities of gait and mobility: Secondary | ICD-10-CM | POA: Diagnosis not present

## 2024-05-19 DIAGNOSIS — R2689 Other abnormalities of gait and mobility: Secondary | ICD-10-CM | POA: Diagnosis not present

## 2024-05-21 DIAGNOSIS — R2689 Other abnormalities of gait and mobility: Secondary | ICD-10-CM | POA: Diagnosis not present

## 2024-05-26 DIAGNOSIS — R2689 Other abnormalities of gait and mobility: Secondary | ICD-10-CM | POA: Diagnosis not present

## 2024-05-28 DIAGNOSIS — R2689 Other abnormalities of gait and mobility: Secondary | ICD-10-CM | POA: Diagnosis not present

## 2024-06-02 DIAGNOSIS — R2689 Other abnormalities of gait and mobility: Secondary | ICD-10-CM | POA: Diagnosis not present

## 2024-06-04 DIAGNOSIS — R2689 Other abnormalities of gait and mobility: Secondary | ICD-10-CM | POA: Diagnosis not present

## 2024-06-09 DIAGNOSIS — R2689 Other abnormalities of gait and mobility: Secondary | ICD-10-CM | POA: Diagnosis not present

## 2024-06-11 DIAGNOSIS — R2689 Other abnormalities of gait and mobility: Secondary | ICD-10-CM | POA: Diagnosis not present

## 2024-06-16 DIAGNOSIS — R2689 Other abnormalities of gait and mobility: Secondary | ICD-10-CM | POA: Diagnosis not present

## 2024-06-18 DIAGNOSIS — R2689 Other abnormalities of gait and mobility: Secondary | ICD-10-CM | POA: Diagnosis not present

## 2024-06-24 DIAGNOSIS — I129 Hypertensive chronic kidney disease with stage 1 through stage 4 chronic kidney disease, or unspecified chronic kidney disease: Secondary | ICD-10-CM | POA: Diagnosis not present

## 2024-06-24 DIAGNOSIS — E1165 Type 2 diabetes mellitus with hyperglycemia: Secondary | ICD-10-CM | POA: Diagnosis not present

## 2024-06-24 DIAGNOSIS — E039 Hypothyroidism, unspecified: Secondary | ICD-10-CM | POA: Diagnosis not present

## 2024-07-01 DIAGNOSIS — N400 Enlarged prostate without lower urinary tract symptoms: Secondary | ICD-10-CM | POA: Diagnosis not present

## 2024-07-01 DIAGNOSIS — B3742 Candidal balanitis: Secondary | ICD-10-CM | POA: Diagnosis not present

## 2024-07-01 DIAGNOSIS — R413 Other amnesia: Secondary | ICD-10-CM | POA: Diagnosis not present

## 2024-07-01 DIAGNOSIS — E1165 Type 2 diabetes mellitus with hyperglycemia: Secondary | ICD-10-CM | POA: Diagnosis not present

## 2024-07-01 DIAGNOSIS — E876 Hypokalemia: Secondary | ICD-10-CM | POA: Diagnosis not present

## 2024-07-01 DIAGNOSIS — Z0001 Encounter for general adult medical examination with abnormal findings: Secondary | ICD-10-CM | POA: Diagnosis not present

## 2024-07-01 DIAGNOSIS — Z0000 Encounter for general adult medical examination without abnormal findings: Secondary | ICD-10-CM | POA: Diagnosis not present

## 2024-07-01 DIAGNOSIS — L309 Dermatitis, unspecified: Secondary | ICD-10-CM | POA: Diagnosis not present

## 2024-07-03 DIAGNOSIS — R051 Acute cough: Secondary | ICD-10-CM | POA: Diagnosis not present

## 2024-07-03 DIAGNOSIS — Z20822 Contact with and (suspected) exposure to covid-19: Secondary | ICD-10-CM | POA: Diagnosis not present

## 2024-07-03 DIAGNOSIS — J3489 Other specified disorders of nose and nasal sinuses: Secondary | ICD-10-CM | POA: Diagnosis not present

## 2024-07-03 DIAGNOSIS — R059 Cough, unspecified: Secondary | ICD-10-CM | POA: Diagnosis not present

## 2024-07-15 ENCOUNTER — Ambulatory Visit (INDEPENDENT_AMBULATORY_CARE_PROVIDER_SITE_OTHER): Payer: Medicare PPO | Admitting: Otolaryngology

## 2024-07-15 ENCOUNTER — Encounter (INDEPENDENT_AMBULATORY_CARE_PROVIDER_SITE_OTHER): Payer: Self-pay

## 2024-07-15 ENCOUNTER — Encounter (INDEPENDENT_AMBULATORY_CARE_PROVIDER_SITE_OTHER): Payer: Self-pay | Admitting: Otolaryngology

## 2024-07-15 VITALS — BP 157/99 | HR 74 | Ht 70.0 in | Wt 153.0 lb

## 2024-07-15 DIAGNOSIS — R09A2 Foreign body sensation, throat: Secondary | ICD-10-CM

## 2024-07-15 DIAGNOSIS — J387 Other diseases of larynx: Secondary | ICD-10-CM

## 2024-07-15 DIAGNOSIS — R1319 Other dysphagia: Secondary | ICD-10-CM

## 2024-07-15 NOTE — Patient Instructions (Signed)
 I have ordered an imaging study for you to complete prior to your next visit. Please call [facility] Scheduling at [phone] to schedule your imaging if you have not received a call within 24 hours. If you are unable to complete your imaging study prior to your next scheduled visit please call our office to let us  know.

## 2024-07-15 NOTE — Progress Notes (Signed)
 Dear Dr. Shona, Here is my assessment for our mutual patient, Corey Frederick. Thank you for allowing me the opportunity to care for your patient. Please do not hesitate to contact me should you have any other questions. Sincerely, Dr. Eldora Blanch  Otolaryngology Clinic Note Referring provider: Dr. Shona HPI:  Corey Frederick is a 89 y.o. male kindly referred by Dr. Shona for evaluation of globus sensation  Initial visit (08/2023): Patient reports: globus sensation for several years, longstanding. He reports that he had an episode where a popcorn husk got in his throat several years ago and then had a sensation of lump in the throat. He reports it is not significantly bothersome, he has gotten used to it. No regurgitation or halitosis. He reports that he does not really have any trouble swallowing.  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 He does report left sided globus and things got worse with globus. He reports that some glob or something came out of his throat. Denies antecedent event including choking, and no problems when he eats, no problems with swallowing. Not losing weight. Patient otherwise denies: - dysphagia, odynophagia, unintentional weight loss - changes in voice, shortness of breath, hemoptysis - ear pain, neck masses  PMHx: Anxiety, Depression, HTN, HLD, Lower GI stricture  H&N Surgery: no Personal or FHx of bleeding  dz or anesthesia difficulty: no  AP/AC: no  Tobacco: quit smoking years ago. Alcohol: no. Lives in [place] [facility] Taught Business Administration  Independent Review of Additional Tests or Records:  Dr. Cinderella (GI) 04/30/2023: noted lack of appetite, globus sensation, throat clearing; reflux. 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; no significant concerning adenopathy noted in neck; airway patent PMH/Meds/All/SocHx/FamHx/ROS:   Past Medical History:  Diagnosis Date   Anxiety    Depression    GERD (gastroesophageal reflux disease)    Hypertension    Hypothyroidism    Pre-diabetes      Past Surgical History:  Procedure Laterality Date   ANTERIOR CERVICAL DECOMP/DISCECTOMY FUSION N/A 07/28/2020   Procedure: [name] ANTERIOR CERVICAL DECOMPRESSION/DISCECTOMY FUSION, ALLOGRAFT, PLATE;  Surgeon: Barbarann Oneil BROCKS, MD;  Location: [facility] OR;  Service: Orthopedics;  Laterality: N/A;   BALLOON DILATION  05/31/2023   Procedure: BALLOON DILATION;  Surgeon: Cinderella Deatrice FALCON, MD;  Location: [facility];  Service: Endoscopy;;   BALLOON DILATION  07/12/2023   Procedure: MERRILL HODGKIN;  Surgeon: Cinderella Deatrice FALCON, MD;  Location: [facility];  Service: Endoscopy;;  pylorus   BIOPSY  05/31/2023   Procedure: BIOPSY;  Surgeon: Cinderella Deatrice FALCON, MD;  Location: [facility];  Service: Endoscopy;;   COLONOSCOPY     5 years ago - Dr. Harvey   ESOPHAGOGASTRODUODENOSCOPY N/A 06/18/2015   Procedure: ESOPHAGOGASTRODUODENOSCOPY (EGD);  Surgeon: Claudis RAYMOND Rivet, MD;  Location: [facility];  Service: Endoscopy;  Laterality: N/A;  155 - moved to 2:40 - [name] notified pt    ESOPHAGOGASTRODUODENOSCOPY (EGD) WITH PROPOFOL  N/A 05/31/2023   Procedure: ESOPHAGOGASTRODUODENOSCOPY (EGD) WITH PROPOFOL ;  Surgeon: Cinderella Deatrice FALCON, MD;  [facility]: [facility];  Service: Endoscopy;  Laterality: N/A;  12:45pm;asa 3   ESOPHAGOGASTRODUODENOSCOPY (EGD) WITH PROPOFOL  N/A 07/12/2023   Procedure: ESOPHAGOGASTRODUODENOSCOPY (EGD) WITH PROPOFOL ;  Surgeon: Cinderella Deatrice FALCON, MD;  Location: [facility];  Service: Endoscopy;  Laterality: N/A;  9:15AM;ASA 3    Family History  Problem Relation Age of Onset   Anxiety disorder Mother    Heart disease Father    Emphysema Sister    Diabetes Brother      Social Connections: Not on file      Current Outpatient Medications:    acetaminophen  (TYLENOL ) 500 MG tablet, Take 500 mg by mouth every 8 (eight) hours as needed., Disp: , Rfl:    clotrimazole (LOTRIMIN) 1 % cream, SMARTSIG:1 Topical Daily, Disp: , Rfl:    donepezil (ARICEPT) 10 MG tablet, Take 10 mg by mouth daily., Disp: , Rfl:    FLUoxetine  (PROZAC ) 10 MG capsule, Take 10 mg by mouth daily., Disp: , Rfl:    fluticasone (CUTIVATE) 0.05 % cream, SMARTSIG:1 Topical Daily, Disp: , Rfl:    GEMTESA 75 MG TABS, Take by mouth daily., Disp: , Rfl:    GOODSENSE COUGH DM 30 MG/5ML liquid, Take by mouth., Disp: , Rfl:    levocetirizine (XYZAL) 5 MG tablet, Take 5 mg by mouth every evening., Disp: , Rfl:    levothyroxine (SYNTHROID) 25 MCG tablet, Take 25 mcg by mouth daily before breakfast., Disp: , Rfl:    loperamide (IMODIUM) 2 MG capsule, Take by mouth as needed for diarrhea or loose stools., Disp: , Rfl:    LORazepam  (ATIVAN ) 1 MG tablet, Take 1 mg by mouth at bedtime., Disp: , Rfl:    memantine (NAMENDA) 10 MG tablet, Take 10 mg by mouth at bedtime., Disp: , Rfl:    Metformin HCl 500 MG/5ML SOLN, Take 5 mLs by mouth 2 (two) times daily., Disp: , Rfl:    pantoprazole  (PROTONIX ) 40 MG tablet, Take 40 mg by mouth daily., Disp: , Rfl:    propranolol (INDERAL) 10 MG tablet, Take 10 mg  by mouth 2 (two) times daily., Disp: , Rfl:    ramipril  (ALTACE ) 5 MG capsule, Take 5 mg by mouth daily., Disp: , Rfl:    rosuvastatin  (CRESTOR ) 10 MG tablet, Take 10 mg by mouth daily., Disp: , Rfl:    sildenafil (VIAGRA) 50 MG tablet, Take 50 mg by mouth daily as needed for erectile dysfunction., Disp: , Rfl:    tamsulosin (FLOMAX) 0.4 MG CAPS capsule, Take 0.4 mg by mouth daily., Disp: , Rfl:    Physical Exam:   BP (!) 157/99 (BP Location: Right Arm, Patient Position: Sitting, Cuff Size: Large)   Pulse 74   Ht 5' 10 (1.778 m)   Wt 153 lb (69.4 kg)   SpO2 93%   BMI 21.95 kg/m   Salient findings:  CN II-XII intact  Bilateral EAC clear and TM intact with well pneumatized middle ear spaces Anterior rhinoscopy: Septum relatively midline; bilateral inferior turbinates without significant hypertrophy No lesions of oral cavity/oropharynx; palpable tongue base without masses No obviously palpable neck masses/lymphadenopathy/thyromegaly No respiratory distress or stridor; no significant throat clearing today; TFL was indicated to better evaluate the proximal airway, given the patient's history and exam findings, and is  detailed below.  Seprately Identifiable Procedures:  Procedure Note Pre-procedure diagnosis: globus sensation, vallecular cyst, persistent symptoms, re-evaluation Post-procedure diagnosis: Same Procedure: Transnasal Fiberoptic Laryngoscopy, CPT 31575 - Mod 25 Indication: see above Complications: None apparent EBL: 0 mL  The procedure was undertaken to further evaluate the patient's complaint of globus sensation and vallecular cysts, with mirror exam inadequate for appropriate examination due to gag reflex and poor patient tolerance  Procedure:  Patient was identified as correct patient. Verbal consent was obtained. The nose was sprayed with oxymetazoline and 4% lidocaine . The The flexible laryngoscope was passed through the nose to view the nasal cavity, pharynx  (oropharynx, hypopharynx) and larynx.  The larynx was examined at rest and during multiple phonatory tasks. Documentation was obtained and reviewed with patient. The scope was removed. The patient tolerated the procedure well.  Findings: The nasal cavity and nasopharynx did not reveal any masses or lesions, mucosa appeared to be without obvious lesions. The tongue base, pharyngeal walls, piriform sinuses, vallecula, epiglottis and postcricoid region are normal in appearance EXCEPT: multiple small cystic masses non-obstructive in vallecula just right of midline; no significant retained secretions. These are stable. Area he points to is around the thyroid  cartilage/pyriform area which did not have any masses. The visualized portion of the subglottis and proximal trachea is widely patent. The vocal folds are mobile bilaterally. There are no lesions on the free edge of the vocal folds nor elsewhere in the larynx worrisome for malignancy.    Electronically signed by: Eldora KATHEE Blanch, MD 07/15/2024 11:40 AM   Impression & Plans:  Corey Frederick is a 89 y.o. male with:  1. Globus sensation   2. Vallecular cyst   3. Esophageal dysphagia    TFL reassuring again, does appear to be vallecular cysts and do not note any other lesions; prior CT without other masses; we discussed options -- observation and bx and repeat imaging. He opted for repeat imaging. Unclear what is causing this -- perhaps referred?  Will repeat CT Neck Will do reflux gourmet after meals in interim If reassuring, may be worth seeing GI again as I do not note large obvious lesions here.  See below regarding exact medications prescribed this encounter including dosages and route: No orders of the defined types were placed in this encounter.     Thank you for allowing me the opportunity to care for your patient. Please do not hesitate to contact me should you have any other questions.  Sincerely, Eldora Blanch, MD Otolarynoglogist  (ENT), [facility] Phone: [phone] Fax: [phone]  07/15/2024, 11:40 AM   MDM:  I have personally spent 31 minutes involved in face-to-face and non-face-to-face activities for this patient on the day of the visit.  Professional time spent excludes any procedures performed but includes the following activities, in addition to those noted in the documentation: preparing to see the patient (review of outside documentation and results), performing a medically appropriate examination, counseling, documenting in the electronic health record

## 2024-07-27 ENCOUNTER — Other Ambulatory Visit: Payer: Self-pay

## 2024-07-27 ENCOUNTER — Emergency Department (HOSPITAL_COMMUNITY)
Admission: EM | Admit: 2024-07-27 | Discharge: 2024-07-28 | Disposition: A | Discharge status: Home / Self Care | Source: Skilled Nursing Facility | Attending: Emergency Medicine | Admitting: Emergency Medicine

## 2024-07-27 ENCOUNTER — Emergency Department (HOSPITAL_COMMUNITY)

## 2024-07-27 DIAGNOSIS — R4182 Altered mental status, unspecified: Secondary | ICD-10-CM | POA: Diagnosis not present

## 2024-07-27 DIAGNOSIS — Z79899 Other long term (current) drug therapy: Secondary | ICD-10-CM | POA: Diagnosis not present

## 2024-07-27 DIAGNOSIS — E1165 Type 2 diabetes mellitus with hyperglycemia: Secondary | ICD-10-CM | POA: Insufficient documentation

## 2024-07-27 DIAGNOSIS — Z7984 Long term (current) use of oral hypoglycemic drugs: Secondary | ICD-10-CM | POA: Diagnosis not present

## 2024-07-27 DIAGNOSIS — F0390 Unspecified dementia without behavioral disturbance: Secondary | ICD-10-CM | POA: Diagnosis not present

## 2024-07-27 DIAGNOSIS — E876 Hypokalemia: Secondary | ICD-10-CM | POA: Diagnosis not present

## 2024-07-27 LAB — URINALYSIS, ROUTINE W REFLEX MICROSCOPIC
Bacteria, UA: NONE SEEN
Bilirubin Urine: NEGATIVE
Glucose, UA: NEGATIVE mg/dL
Hgb urine dipstick: NEGATIVE
Ketones, ur: 5 mg/dL — AB
Leukocytes,Ua: NEGATIVE
Nitrite: NEGATIVE
Protein, ur: 30 mg/dL — AB
Specific Gravity, Urine: 1.021 (ref 1.005–1.030)
pH: 5 (ref 5.0–8.0)

## 2024-07-27 LAB — COMPREHENSIVE METABOLIC PANEL WITH GFR
ALT: 12 U/L (ref 0–44)
AST: 28 U/L (ref 15–41)
Albumin: 3.6 g/dL (ref 3.5–5.0)
Alkaline Phosphatase: 52 U/L (ref 38–126)
Anion gap: 15 (ref 5–15)
BUN: 23 mg/dL (ref 8–23)
CO2: 19 mmol/L — ABNORMAL LOW (ref 22–32)
Calcium: 7.6 mg/dL — ABNORMAL LOW (ref 8.9–10.3)
Chloride: 104 mmol/L (ref 98–111)
Creatinine, Ser: 1.18 mg/dL (ref 0.61–1.24)
GFR, Estimated: 60 mL/min — ABNORMAL LOW
Glucose, Bld: 152 mg/dL — ABNORMAL HIGH (ref 70–99)
Potassium: 3.1 mmol/L — ABNORMAL LOW (ref 3.5–5.1)
Sodium: 138 mmol/L (ref 135–145)
Total Bilirubin: 0.5 mg/dL (ref 0.0–1.2)
Total Protein: 6.3 g/dL — ABNORMAL LOW (ref 6.5–8.1)

## 2024-07-27 LAB — CBC
HCT: 36.6 % — ABNORMAL LOW (ref 39.0–52.0)
Hemoglobin: 12.9 g/dL — ABNORMAL LOW (ref 13.0–17.0)
MCH: 31.9 pg (ref 26.0–34.0)
MCHC: 35.2 g/dL (ref 30.0–36.0)
MCV: 90.4 fL (ref 80.0–100.0)
Platelets: 167 K/uL (ref 150–400)
RBC: 4.05 MIL/uL — ABNORMAL LOW (ref 4.22–5.81)
RDW: 13 % (ref 11.5–15.5)
WBC: 7.6 K/uL (ref 4.0–10.5)
nRBC: 0 % (ref 0.0–0.2)

## 2024-07-27 LAB — CBG MONITORING, ED: Glucose-Capillary: 178 mg/dL — ABNORMAL HIGH (ref 70–99)

## 2024-07-27 LAB — MAGNESIUM: Magnesium: 1 mg/dL — ABNORMAL LOW (ref 1.7–2.4)

## 2024-07-27 MED ORDER — POTASSIUM CHLORIDE CRYS ER 20 MEQ PO TBCR
40.0000 meq | EXTENDED_RELEASE_TABLET | Freq: Once | ORAL | Status: AC
  Administered 2024-07-27: 40 meq via ORAL
  Filled 2024-07-27: qty 2

## 2024-07-27 MED ORDER — MAGNESIUM SULFATE 4 GM/100ML IV SOLN
4.0000 g | Freq: Once | INTRAVENOUS | Status: AC
  Administered 2024-07-27: 4 g via INTRAVENOUS
  Filled 2024-07-27: qty 100

## 2024-07-27 NOTE — ED Triage Notes (Signed)
 Pt BIB [facility] from [facility] for hyperglycemia and AMS. [facility] staff concerned for AMS d/t pt talking about Jesus. Hx of dementia and diabetes.   CBG with EMS 270

## 2024-07-27 NOTE — ED Notes (Signed)
 Attempted to use urinal, unsuccessful at this time.

## 2024-07-27 NOTE — Discharge Instructions (Addendum)
 Thank you for visiting the Emergency Department today. It was a pleasure to be part of your healthcare team.   Your were seen today for hyperglycemia and AMS, your workup was overall reassuring-you had some mild hypokalemia and hypomagnesia that were both corrected in the emergency department today.  Please follow up with your primary care provider within 1 week for reevaluation.   Any new or worsening symptoms arise please report back to your nearest emergency department for reassessment. Thank you for trusting us  with your health.

## 2024-07-27 NOTE — ED Provider Notes (Signed)
 " [facility] AT [facility] Provider Note   CSN: [id number] Arrival date & time: 07/27/24  1625     Patient presents with: Hyperglycemia   Corey Frederick is a 89 y.o. male with a history of dementia and diabetes, brought in by EMS from nursing facility, who presents to the ED with hyperglycemia and altered mental status that began today.  Facility states that patient was talking about Jesus and became concerned when his BGL was also 250.  Patient unable to answer history questions appropriately due to dementia, however when asked how he was feeling he was able to state that he was feeling fine and is unsure why he was here.  Patient appears in no acute distress.  Level 5 caveat - Dementia    Hyperglycemia      Prior to Admission medications  Medication Sig Start Date End Date Taking? Authorizing Provider  acetaminophen  (TYLENOL ) 500 MG tablet Take 500 mg by mouth every 8 (eight) hours as needed.    [provider]  clotrimazole (LOTRIMIN) 1 % cream SMARTSIG:1 Topical Daily 07/01/24   [provider]  donepezil (ARICEPT) 10 MG tablet Take 10 mg by mouth daily. 09/01/22   [provider]  FLUoxetine  (PROZAC ) 10 MG capsule Take 10 mg by mouth daily. 09/15/19   [provider]  fluticasone (CUTIVATE) 0.05 % cream SMARTSIG:1 Topical Daily 07/01/24   [provider]  GEMTESA 75 MG TABS Take by mouth daily. 09/26/22   [provider]  GOODSENSE COUGH DM 30 MG/5ML liquid Take by mouth. 07/03/24   [provider]  levocetirizine (XYZAL) 5 MG tablet Take 5 mg by mouth every evening.    [provider]  levothyroxine (SYNTHROID) 25 MCG tablet Take 25 mcg by mouth daily before breakfast.    [provider]  loperamide (IMODIUM) 2 MG capsule Take by mouth as needed for diarrhea or loose stools.    [provider]  LORazepam  (ATIVAN ) 1 MG tablet Take 1 mg by mouth at bedtime.     [provider]  memantine (NAMENDA) 10 MG tablet Take 10 mg by mouth at bedtime.    [provider]  Metformin HCl 500 MG/5ML SOLN Take 5 mLs by mouth 2 (two) times daily. 01/02/22   [provider]  pantoprazole  (PROTONIX ) 40 MG tablet Take 40 mg by mouth daily.    [provider]  propranolol (INDERAL) 10 MG tablet Take 10 mg by mouth 2 (two) times daily. 12/10/21   [provider]  ramipril  (ALTACE ) 5 MG capsule Take 5 mg by mouth daily.    [provider]  rosuvastatin  (CRESTOR ) 10 MG tablet Take 10 mg by mouth daily. 11/17/19   [provider]  sildenafil (VIAGRA) 50 MG tablet Take 50 mg by mouth daily as needed for erectile dysfunction.    [provider]  tamsulosin (FLOMAX) 0.4 MG CAPS capsule Take 0.4 mg by mouth daily.    [provider]    Allergies: Patient has no known allergies.    Review of Systems  Reason unable to perform ROS: Dementia.  Psychiatric/Behavioral:  Positive for behavioral problems.     Updated Vital Signs BP 120/72 (BP Location: Right Arm)   Pulse 91   Temp 98.5 F (36.9 C) (Oral)   Resp 19   Wt 70 kg   SpO2 95%   BMI 22.14 kg/m   Physical Exam Vitals and nursing note reviewed.  Constitutional:  General: He is not in acute distress.    Appearance: Normal appearance. He is not toxic-appearing.  HENT:     Head: Normocephalic and atraumatic.  Eyes:     Extraocular Movements: Extraocular movements intact.     Conjunctiva/sclera: Conjunctivae normal.     Pupils: Pupils are equal, round, and reactive to light.  Cardiovascular:     Rate and Rhythm: Normal rate and regular rhythm.     Pulses: Normal pulses.  Pulmonary:     Effort: Pulmonary effort is normal. No respiratory distress.  Abdominal:     General: Abdomen is flat.     Palpations: Abdomen is soft.     Tenderness: There is no abdominal tenderness.  Musculoskeletal:        General: Normal range of motion.      Cervical back: Normal range of motion.  Skin:    General: Skin is warm and dry.     Capillary Refill: Capillary refill takes less than 2 seconds.  Neurological:     General: No focal deficit present.     Mental Status: He is alert. Mental status is at baseline.     Comments: Patient alert and oriented to baseline.  Speech clear and appropriate.  No aphasia or dysarthria.   Cranial nerves III through XII intact: Motor strength 5-5 in all extremities with normal tone and no pronator drift.   Sensation intact to light touch in upper and lower extremities bilaterally.  Psychiatric:        Mood and Affect: Mood normal.        Behavior: Behavior is cooperative.        Cognition and Memory: Cognition is impaired. Memory is impaired.     (all labs ordered are listed, but only abnormal results are displayed) Labs Reviewed  COMPREHENSIVE METABOLIC PANEL WITH GFR - Abnormal; Notable for the following components:      Result Value   Potassium 3.1 (*)    CO2 19 (*)    Glucose, Bld 152 (*)    Calcium  7.6 (*)    Total Protein 6.3 (*)    GFR, Estimated 60 (*)    All other components within normal limits  CBC - Abnormal; Notable for the following components:   RBC 4.05 (*)    Hemoglobin 12.9 (*)    HCT 36.6 (*)    All other components within normal limits  URINALYSIS, ROUTINE W REFLEX MICROSCOPIC - Abnormal; Notable for the following components:   APPearance CLOUDY (*)    Ketones, ur 5 (*)    Protein, ur 30 (*)    All other components within normal limits  MAGNESIUM  - Abnormal; Notable for the following components:   Magnesium  1.0 (*)    All other components within normal limits  CBG MONITORING, ED - Abnormal; Notable for the following components:   Glucose-Capillary 178 (*)    All other components within normal limits    EKG: EKG Interpretation Date/Time:  Sunday July 27 2024 17:03:29 EST Ventricular Rate:  78 PR Interval:  182 QRS Duration:  86 QT Interval:  397 QTC  Calculation: 453 R Axis:   -30  Text Interpretation: Sinus rhythm Left axis deviation Low voltage, extremity and precordial leads Consider anterior infarct Confirmed by Patsey Lot [phone]) on 07/27/2024 10:35:26 PM  Radiology: No results found.   Procedures   Medications Ordered in the ED  potassium chloride  SA (KLOR-CON  M) CR tablet 40 mEq (40 mEq Oral Given 07/27/24 1752)  magnesium  sulfate IVPB 4  g 100 mL (0 g Intravenous Stopped 07/27/24 1956)                                  Medical Decision Making Amount and/or Complexity of Data Reviewed Labs: ordered. Decision-making details documented in ED Course. Radiology: ordered. Decision-making details documented in ED Course. ECG/medicine tests:  Decision-making details documented in ED Course.  Risk Prescription drug management.   Patient presents to the ED for: Altered mental status, hyperglycemia This involves an extensive number of treatment options Differential diagnosis includes: Metabolic etiology Cardiac etiology Complications with diagnosed dementia Infectious etiology Co-morbid conditions: Dementia, diabetes  Additional history/records obtained and reviewed: Additional history obtained from  nursing facility and EMS report External records from outside source obtained and reviewed including family medical records regarding patient's current medication regimen and medical history.  Clinical Course as of 08/08/24 0714  Sun Jul 27, 2024  1643 Temp: 74 F (36.7 C) Afebrile, vital stable, patient no acute distress [ML]  1737 Magnesium (!) Low - 1 [ML]  1737 Comprehensive metabolic panel(!) Hypokalemia [ML]  1737 CBC(!) WNL [ML]  1737 EKG 12-Lead Sinus rhythm [ML]  2000 Patient given potassium chloride  supplement and magnesium  sulfate administration for hypokalemia and hypomagnesia - well tolerated [ML]  2112 Comprehensive metabolic panel(!) Hypokalemia 3.1 [ML]  2112 CT Head Wo Contrast No acute  findings [ML]  2113 EKG 12-Lead Sinus rhythm [ML]  2129 Urinalysis, Routine w reflex microscopic -Urine, Clean CatchMAGNUS [place]   [ML]    Clinical Course User Index [ML] Willma Duwaine CROME, PA    Data Reviewed / Actions Taken: Labs ordered/reviewed with my independent interpretation in ED course above. Imaging ordered/reviewed with my independent interpretation in ED course above. I agree with the radiologists interpretation.  EKG ordered/reviewed with my independent interpretation in ED course above. The patient did not require continuous cardiac monitoring during the ED stay. Key findings for the patient were reviewed with the attending physician, and ongoing clinical collaboration was maintained throughout the visit  Management / Treatments: See ED course above for medications, treatments administered, and clinical rationale.   Reevaluation of the patient after these medicines showed that the patient tolerated medication well I have reviewed the patients home medicines and have made adjustments as needed  ED Course / Reassessments: Problem List: Metabolic/electrolyte abnormality 89 year old male presented for evaluation of altered mental status from nursing facility. Initial assessment included history, physical exam, and review of prior medical records. Laboratory studies, imaging, and other ancillary studies were obtained and key results included hypomagnesia and hypokalemia. Management included repletion of both magnesium  and potassium levels, with ongoing reassessment.  Imaging was obtained without acute finding.  EKG was clear of any acute ischemia.  Vital signs were obtained and monitored, and the patient remained stable throughout the stay.  Patient's confusion likely coming from advanced dementia.  His weakness could be contributed to low magnesium  which was repleted at today's visit.  Given the patient's reassuring workup, stable for discharge.   Patient response: Tolerated  treatment regimen well Serial reassessments performed: Yes    Social determinants impacting care: mental health/psychiatric barriers   Disposition: Disposition: Discharge with close follow-up with facility PCP for further evaluation and care Rationale for disposition: Stable for discharge The disposition plan and rationale were discussed with the patient at the bedside, all questions were addressed, and the patient demonstrated understanding.  This note was produced using Electronics Engineer. While  I have reviewed and verified all clinical information, transcription errors may remain.      Final diagnoses:  Altered mental status, unspecified altered mental status type    ED Discharge Orders     None          Willma Duwaine CROME, [place] 08/08/24 9283  "

## 2024-07-28 NOTE — ED Notes (Signed)
 Pt ambulated to restroom with only steadying assist. Bertina Solum RN

## 2024-07-28 NOTE — ED Notes (Signed)
 Patient awaiting transport by [facility] to [facility]. Patient is pleasantly confused. He is ambulatory with assistance. He is currently sitting in chair eating sandwich. Denies further needs. Kellogg RN

## 2024-09-15 ENCOUNTER — Ambulatory Visit (INDEPENDENT_AMBULATORY_CARE_PROVIDER_SITE_OTHER): Admitting: Otolaryngology
